# Patient Record
Sex: Female | Born: 1960 | Race: White | Hispanic: No | Marital: Married | Smoking: Never smoker
Health system: Southern US, Community
[De-identification: ages and names within clinical notes are randomized; demographics above are authoritative.]

## PROBLEM LIST (undated history)

## (undated) DIAGNOSIS — M810 Age-related osteoporosis without current pathological fracture: Secondary | ICD-10-CM

## (undated) DIAGNOSIS — E785 Hyperlipidemia, unspecified: Secondary | ICD-10-CM

## (undated) DIAGNOSIS — T7840XA Allergy, unspecified, initial encounter: Secondary | ICD-10-CM

## (undated) DIAGNOSIS — E079 Disorder of thyroid, unspecified: Secondary | ICD-10-CM

## (undated) HISTORY — DX: Disorder of thyroid, unspecified: E07.9

## (undated) HISTORY — DX: Age-related osteoporosis without current pathological fracture: M81.0

## (undated) HISTORY — DX: Allergy, unspecified, initial encounter: T78.40XA

## (undated) HISTORY — PX: TONSILLECTOMY: SHX5217

## (undated) HISTORY — DX: Hyperlipidemia, unspecified: E78.5

---

## 1997-09-12 ENCOUNTER — Other Ambulatory Visit: Admission: RE | Admit: 1997-09-12 | Discharge: 1997-09-12 | Payer: Self-pay | Admitting: *Deleted

## 1997-12-20 ENCOUNTER — Ambulatory Visit (HOSPITAL_COMMUNITY): Admission: RE | Admit: 1997-12-20 | Discharge: 1997-12-20 | Payer: Self-pay | Admitting: *Deleted

## 1998-11-04 ENCOUNTER — Other Ambulatory Visit: Admission: RE | Admit: 1998-11-04 | Discharge: 1998-11-04 | Payer: Self-pay | Admitting: *Deleted

## 1999-12-09 ENCOUNTER — Other Ambulatory Visit: Admission: RE | Admit: 1999-12-09 | Discharge: 1999-12-09 | Payer: Self-pay | Admitting: Obstetrics and Gynecology

## 2001-01-28 ENCOUNTER — Other Ambulatory Visit: Admission: RE | Admit: 2001-01-28 | Discharge: 2001-01-28 | Payer: Self-pay | Admitting: Obstetrics and Gynecology

## 2001-11-15 ENCOUNTER — Other Ambulatory Visit: Admission: RE | Admit: 2001-11-15 | Discharge: 2001-11-15 | Payer: Self-pay | Admitting: Obstetrics and Gynecology

## 2002-03-02 ENCOUNTER — Emergency Department (HOSPITAL_COMMUNITY): Admission: EM | Admit: 2002-03-02 | Discharge: 2002-03-02 | Payer: Self-pay | Admitting: Emergency Medicine

## 2002-12-05 ENCOUNTER — Other Ambulatory Visit: Admission: RE | Admit: 2002-12-05 | Discharge: 2002-12-05 | Payer: Self-pay | Admitting: Obstetrics and Gynecology

## 2004-01-10 ENCOUNTER — Other Ambulatory Visit: Admission: RE | Admit: 2004-01-10 | Discharge: 2004-01-10 | Payer: Self-pay | Admitting: Obstetrics and Gynecology

## 2005-03-03 ENCOUNTER — Other Ambulatory Visit: Admission: RE | Admit: 2005-03-03 | Discharge: 2005-03-03 | Payer: Self-pay | Admitting: Obstetrics and Gynecology

## 2010-06-17 ENCOUNTER — Encounter (INDEPENDENT_AMBULATORY_CARE_PROVIDER_SITE_OTHER): Payer: Self-pay | Admitting: *Deleted

## 2010-06-26 NOTE — Letter (Signed)
Summary: Pre Visit Letter Revised  [facility]  [address] [place], [place] [address]   Phone: [phone]  Fax: [phone]        06/17/2010 MRN: [id number] [facility] [address] [place], [place]  [address]             Procedure Date:  08/05/2010 @ 9:00   Direct colon-Dr. Christella Hartigan   Welcome to the Gastroenterology Division at [facility].    You are scheduled to see a nurse for your pre-procedure visit on 07/24/2010 at 9:00 on the 3rd floor at [facility], [address]  We ask that you try to arrive at our office 15 minutes prior to your appointment time to allow for check-in.  Please take a minute to review the attached form.  If you answer "Yes" to one or more of the questions on the first page, we ask that you call the person listed at your earliest opportunity.  If you answer "No" to all of the questions, please complete the rest of the form and bring it to your appointment.    Your nurse visit will consist of discussing your medical and surgical history, your immediate family medical history, and your medications.   If you are unable to list all of your medications on the form, please bring the medication bottles to your appointment and we will list them.  We will need to be aware of both prescribed and over the counter drugs.  We will need to know exact dosage information as well.    Please be prepared to read and sign documents such as consent forms, a financial agreement, and acknowledgement forms.  If necessary, and with your consent, a friend or relative is welcome to sit-in on the nurse visit with you.  Please bring your insurance card so that we may make a copy of it.  If your insurance requires a referral to see a specialist, please bring your referral form from your primary care physician.  No co-pay is required for this nurse visit.     If you cannot keep your appointment, please call [phone] to cancel or reschedule prior to your  appointment date.  This allows [place] the opportunity to schedule an appointment for another patient in need of care.    Thank you for choosing [facility] for your medical needs.  We appreciate the opportunity to care for you.  Please visit [place] at our website  to learn more about our practice.  Sincerely, The Gastroenterology Division

## 2010-07-24 ENCOUNTER — Ambulatory Visit (AMBULATORY_SURGERY_CENTER): Payer: Federal, State, Local not specified - PPO | Admitting: *Deleted

## 2010-07-24 VITALS — Ht 67.0 in | Wt 152.9 lb

## 2010-07-24 DIAGNOSIS — Z1211 Encounter for screening for malignant neoplasm of colon: Secondary | ICD-10-CM

## 2010-07-24 MED ORDER — PEG-KCL-NACL-NASULF-NA ASC-C 100 G PO SOLR: ORAL | Status: DC

## 2010-08-05 ENCOUNTER — Encounter: Payer: Self-pay | Admitting: Gastroenterology

## 2010-08-05 ENCOUNTER — Ambulatory Visit (AMBULATORY_SURGERY_CENTER): Payer: Federal, State, Local not specified - PPO | Admitting: Gastroenterology

## 2010-08-05 VITALS — BP 142/75 | HR 90 | Temp 97.4°F | Resp 20 | Ht 67.0 in | Wt 152.0 lb

## 2010-08-05 DIAGNOSIS — Z1211 Encounter for screening for malignant neoplasm of colon: Secondary | ICD-10-CM

## 2010-08-05 DIAGNOSIS — K5730 Diverticulosis of large intestine without perforation or abscess without bleeding: Secondary | ICD-10-CM

## 2010-08-05 MED ORDER — SODIUM CHLORIDE 0.9 % IV SOLN: 500.0000 mL | INTRAVENOUS | Status: AC

## 2010-08-05 NOTE — Patient Instructions (Signed)
Mild diverticulosis Repeat colonoscopy in 10 yrs. No need for stool testing for at least 5 years. See blue and green sheet for additional d/c instructions.

## 2010-08-06 ENCOUNTER — Telehealth: Payer: Self-pay | Admitting: *Deleted

## 2010-08-06 NOTE — Telephone Encounter (Signed)
No answer but left message to call us if she had any questions or if having any problems.  ID on answering machine

## 2015-05-02 MED FILL — SYNTHROID 137 MCG TABLET: 137 | 90 days supply | Qty: 90 | Fill #3

## 2015-08-05 MED FILL — SYNTHROID 137 MCG TABLET: 137 | 30 days supply | Qty: 30 | Fill #0

## 2015-08-26 DIAGNOSIS — J309 Allergic rhinitis, unspecified: Secondary | ICD-10-CM | POA: Diagnosis not present

## 2015-08-26 DIAGNOSIS — E039 Hypothyroidism, unspecified: Secondary | ICD-10-CM | POA: Diagnosis not present

## 2015-08-26 DIAGNOSIS — E7800 Pure hypercholesterolemia, unspecified: Secondary | ICD-10-CM | POA: Diagnosis not present

## 2015-08-30 DIAGNOSIS — E7800 Pure hypercholesterolemia, unspecified: Secondary | ICD-10-CM | POA: Diagnosis not present

## 2015-08-30 DIAGNOSIS — E039 Hypothyroidism, unspecified: Secondary | ICD-10-CM | POA: Diagnosis not present

## 2015-09-02 MED FILL — PRAVASTATIN NA 40 MG TAB: 40 | 30 days supply | Qty: 30 | Fill #0

## 2015-09-04 MED FILL — SYNTHROID 137 MCG TABLET: 137 | 30 days supply | Qty: 30 | Fill #0

## 2015-10-03 MED FILL — PRAVASTATIN NA 40 MG TAB: 40 | 30 days supply | Qty: 30 | Fill #1

## 2015-10-03 MED FILL — SYNTHROID 137 MCG TABLET: 137 | 30 days supply | Qty: 30 | Fill #1

## 2015-11-05 MED FILL — PRAVASTATIN NA 40 MG TAB: 40 | 30 days supply | Qty: 30 | Fill #2

## 2015-11-05 MED FILL — SYNTHROID 137 MCG TABLET: 137 | 30 days supply | Qty: 30 | Fill #2

## 2015-11-07 DIAGNOSIS — E7800 Pure hypercholesterolemia, unspecified: Secondary | ICD-10-CM | POA: Diagnosis not present

## 2015-12-05 MED FILL — PRAVASTATIN NA 40 MG TAB: 40 | 30 days supply | Qty: 30 | Fill #3

## 2015-12-05 MED FILL — SYNTHROID 137 MCG TABLET: 137 | 30 days supply | Qty: 30 | Fill #3

## 2015-12-30 DIAGNOSIS — Z1231 Encounter for screening mammogram for malignant neoplasm of breast: Secondary | ICD-10-CM | POA: Diagnosis not present

## 2015-12-30 DIAGNOSIS — Z6824 Body mass index (BMI) 24.0-24.9, adult: Secondary | ICD-10-CM | POA: Diagnosis not present

## 2015-12-30 DIAGNOSIS — Z01419 Encounter for gynecological examination (general) (routine) without abnormal findings: Secondary | ICD-10-CM | POA: Diagnosis not present

## 2015-12-30 DIAGNOSIS — Z1212 Encounter for screening for malignant neoplasm of rectum: Secondary | ICD-10-CM | POA: Diagnosis not present

## 2016-01-02 MED FILL — SYNTHROID 137 MCG TABLET: 137 | 30 days supply | Qty: 30 | Fill #4

## 2016-01-02 MED FILL — PRAVASTATIN NA 40 MG TAB: 40 | 30 days supply | Qty: 30 | Fill #4

## 2016-01-13 DIAGNOSIS — M25531 Pain in right wrist: Secondary | ICD-10-CM | POA: Diagnosis not present

## 2016-01-13 DIAGNOSIS — S52614A Nondisplaced fracture of right ulna styloid process, initial encounter for closed fracture: Secondary | ICD-10-CM | POA: Diagnosis not present

## 2016-02-03 DIAGNOSIS — S52614D Nondisplaced fracture of right ulna styloid process, subsequent encounter for closed fracture with routine healing: Secondary | ICD-10-CM | POA: Diagnosis not present

## 2016-02-03 DIAGNOSIS — S52551D Other extraarticular fracture of lower end of right radius, subsequent encounter for closed fracture with routine healing: Secondary | ICD-10-CM | POA: Diagnosis not present

## 2016-02-03 MED FILL — SYNTHROID 137 MCG TABLET: 137 | 30 days supply | Qty: 30 | Fill #5

## 2016-02-03 MED FILL — PRAVASTATIN NA 40 MG TAB: 40 | 30 days supply | Qty: 30 | Fill #5

## 2016-02-27 DIAGNOSIS — S52614D Nondisplaced fracture of right ulna styloid process, subsequent encounter for closed fracture with routine healing: Secondary | ICD-10-CM | POA: Diagnosis not present

## 2016-03-09 MED FILL — SYNTHROID 137 MCG TABLET: 137 | 30 days supply | Qty: 30 | Fill #6

## 2016-03-09 MED FILL — PRAVASTATIN NA 40 MG TAB: 40 | 30 days supply | Qty: 30 | Fill #6

## 2016-03-23 DIAGNOSIS — S52614D Nondisplaced fracture of right ulna styloid process, subsequent encounter for closed fracture with routine healing: Secondary | ICD-10-CM | POA: Diagnosis not present

## 2016-04-08 DIAGNOSIS — S52551D Other extraarticular fracture of lower end of right radius, subsequent encounter for closed fracture with routine healing: Secondary | ICD-10-CM | POA: Diagnosis not present

## 2016-04-08 DIAGNOSIS — M25531 Pain in right wrist: Secondary | ICD-10-CM | POA: Diagnosis not present

## 2016-04-08 MED FILL — PRAVASTATIN NA 40 MG TAB: 40 | 30 days supply | Qty: 30 | Fill #7

## 2016-04-08 MED FILL — SYNTHROID 137 MCG TABLET: 137 | 30 days supply | Qty: 30 | Fill #7

## 2016-04-15 DIAGNOSIS — S52551D Other extraarticular fracture of lower end of right radius, subsequent encounter for closed fracture with routine healing: Secondary | ICD-10-CM | POA: Diagnosis not present

## 2016-05-11 MED FILL — PRAVASTATIN NA 40 MG TAB: 40 | 30 days supply | Qty: 30 | Fill #8

## 2016-05-11 MED FILL — SYNTHROID 137 MCG TABLET: 137 | 30 days supply | Qty: 30 | Fill #8

## 2016-05-20 DIAGNOSIS — S52614D Nondisplaced fracture of right ulna styloid process, subsequent encounter for closed fracture with routine healing: Secondary | ICD-10-CM | POA: Diagnosis not present

## 2016-05-20 DIAGNOSIS — M25531 Pain in right wrist: Secondary | ICD-10-CM | POA: Diagnosis not present

## 2016-05-28 DIAGNOSIS — S52551D Other extraarticular fracture of lower end of right radius, subsequent encounter for closed fracture with routine healing: Secondary | ICD-10-CM | POA: Diagnosis not present

## 2016-05-28 DIAGNOSIS — S66911A Strain of unspecified muscle, fascia and tendon at wrist and hand level, right hand, initial encounter: Secondary | ICD-10-CM | POA: Diagnosis not present

## 2016-05-28 DIAGNOSIS — S52614D Nondisplaced fracture of right ulna styloid process, subsequent encounter for closed fracture with routine healing: Secondary | ICD-10-CM | POA: Diagnosis not present

## 2016-05-28 DIAGNOSIS — M25531 Pain in right wrist: Secondary | ICD-10-CM | POA: Diagnosis not present

## 2016-06-12 MED FILL — SYNTHROID 137 MCG TABLET: 137 | 30 days supply | Qty: 30 | Fill #9

## 2016-06-12 MED FILL — PRAVASTATIN NA 40 MG TAB: 40 | 30 days supply | Qty: 30 | Fill #9

## 2016-07-13 MED FILL — SYNTHROID 137 MCG TABLET: 137 | 30 days supply | Qty: 30 | Fill #10

## 2016-07-13 MED FILL — PRAVASTATIN NA 40 MG TAB: 40 | 30 days supply | Qty: 30 | Fill #10

## 2016-08-12 MED FILL — PRAVASTATIN NA 40 MG TAB: 40 | 30 days supply | Qty: 30 | Fill #11

## 2016-08-12 MED FILL — SYNTHROID 137 MCG TABLET: 137 | 30 days supply | Qty: 30 | Fill #11

## 2016-09-17 MED FILL — PRAVASTATIN NA 40 MG TAB: 40 | 30 days supply | Qty: 30 | Fill #0

## 2016-09-17 MED FILL — LEVOTHYROXINE 137 MCG TAB: 137 | 30 days supply | Qty: 30 | Fill #0

## 2016-10-02 DIAGNOSIS — Z23 Encounter for immunization: Secondary | ICD-10-CM | POA: Diagnosis not present

## 2016-10-02 DIAGNOSIS — J309 Allergic rhinitis, unspecified: Secondary | ICD-10-CM | POA: Diagnosis not present

## 2016-10-02 DIAGNOSIS — E7800 Pure hypercholesterolemia, unspecified: Secondary | ICD-10-CM | POA: Diagnosis not present

## 2016-10-02 DIAGNOSIS — E039 Hypothyroidism, unspecified: Secondary | ICD-10-CM | POA: Diagnosis not present

## 2016-10-02 DIAGNOSIS — Z1159 Encounter for screening for other viral diseases: Secondary | ICD-10-CM | POA: Diagnosis not present

## 2016-10-02 DIAGNOSIS — Z0000 Encounter for general adult medical examination without abnormal findings: Secondary | ICD-10-CM | POA: Diagnosis not present

## 2016-10-06 MED FILL — LEVOTHYROXINE 125 MCG TAB: 125 | 30 days supply | Qty: 30 | Fill #0

## 2016-11-04 MED FILL — LEVOTHYROXINE 125 MCG TAB: 125 | 30 days supply | Qty: 30 | Fill #1

## 2016-11-04 MED FILL — PRAVASTATIN NA 40 MG TAB: 40 | 30 days supply | Qty: 30 | Fill #1

## 2016-12-02 DIAGNOSIS — E039 Hypothyroidism, unspecified: Secondary | ICD-10-CM | POA: Diagnosis not present

## 2016-12-02 DIAGNOSIS — M8588 Other specified disorders of bone density and structure, other site: Secondary | ICD-10-CM | POA: Diagnosis not present

## 2016-12-14 MED FILL — PRAVASTATIN NA 40 MG TAB: 40 | 30 days supply | Qty: 30 | Fill #0

## 2016-12-14 MED FILL — LEVOTHYROXINE 125 MCG TAB: 125 | 30 days supply | Qty: 30 | Fill #2

## 2017-01-14 MED FILL — PRAVASTATIN NA 40 MG TAB: 40 | 30 days supply | Qty: 30 | Fill #1

## 2017-01-14 MED FILL — LEVOTHYROXINE 125 MCG TAB: 125 | 30 days supply | Qty: 30 | Fill #3

## 2017-02-02 DIAGNOSIS — Z01419 Encounter for gynecological examination (general) (routine) without abnormal findings: Secondary | ICD-10-CM | POA: Diagnosis not present

## 2017-02-02 DIAGNOSIS — R319 Hematuria, unspecified: Secondary | ICD-10-CM | POA: Diagnosis not present

## 2017-02-02 DIAGNOSIS — N390 Urinary tract infection, site not specified: Secondary | ICD-10-CM | POA: Diagnosis not present

## 2017-02-02 DIAGNOSIS — Z1231 Encounter for screening mammogram for malignant neoplasm of breast: Secondary | ICD-10-CM | POA: Diagnosis not present

## 2017-02-02 DIAGNOSIS — Z6827 Body mass index (BMI) 27.0-27.9, adult: Secondary | ICD-10-CM | POA: Diagnosis not present

## 2017-02-05 MED FILL — CIPROFLOXACIN HCL 500 MG TA: 500 | 7 days supply | Qty: 14 | Fill #0

## 2017-02-12 MED FILL — LEVOTHYROXINE 125 MCG TAB: 125 | 30 days supply | Qty: 30 | Fill #4

## 2017-02-12 MED FILL — PRAVASTATIN NA 40 MG TAB: 40 | 30 days supply | Qty: 30 | Fill #2

## 2017-03-08 DIAGNOSIS — E039 Hypothyroidism, unspecified: Secondary | ICD-10-CM | POA: Diagnosis not present

## 2017-03-15 MED FILL — LEVOTHYROXINE 125 MCG TAB: 125 | 30 days supply | Qty: 30 | Fill #5

## 2017-03-15 MED FILL — PRAVASTATIN NA 40 MG TAB: 40 | 30 days supply | Qty: 30 | Fill #3

## 2017-03-22 MED FILL — LEVOTHYROXINE 112 MCG TAB: 112 | 30 days supply | Qty: 30 | Fill #0

## 2017-04-15 MED FILL — PRAVASTATIN NA 40 MG TAB: 40 | 30 days supply | Qty: 30 | Fill #4

## 2017-04-29 DIAGNOSIS — H1851 Endothelial corneal dystrophy: Secondary | ICD-10-CM | POA: Diagnosis not present

## 2017-05-24 MED FILL — LEVOTHYROXINE 112 MCG TAB: 112 | 30 days supply | Qty: 30 | Fill #1

## 2017-05-24 MED FILL — PRAVASTATIN NA 40 MG TAB: 40 | 30 days supply | Qty: 30 | Fill #5

## 2017-06-18 DIAGNOSIS — E039 Hypothyroidism, unspecified: Secondary | ICD-10-CM | POA: Diagnosis not present

## 2017-06-18 MED FILL — PRAVASTATIN NA 40 MG TAB: 40 | 30 days supply | Qty: 30 | Fill #0

## 2017-06-24 MED FILL — LEVOTHYROXINE 112 MCG TAB: 112 | 30 days supply | Qty: 30 | Fill #0

## 2017-07-26 MED FILL — LEVOTHYROXINE 112 MCG TAB: 112 | 30 days supply | Qty: 30 | Fill #1

## 2017-07-26 MED FILL — PRAVASTATIN NA 40 MG TAB: 40 | 30 days supply | Qty: 30 | Fill #1

## 2017-08-24 MED FILL — LEVOTHYROXINE 112 MCG TAB: 112 | 30 days supply | Qty: 30 | Fill #2

## 2017-08-24 MED FILL — PRAVASTATIN NA 40 MG TAB: 40 | 30 days supply | Qty: 30 | Fill #2

## 2017-09-23 MED FILL — PRAVASTATIN NA 40 MG TAB: 40 | 30 days supply | Qty: 30 | Fill #3

## 2017-09-23 MED FILL — LEVOTHYROXINE 112 MCG TAB: 112 | 30 days supply | Qty: 30 | Fill #3

## 2017-10-19 DIAGNOSIS — E7800 Pure hypercholesterolemia, unspecified: Secondary | ICD-10-CM | POA: Diagnosis not present

## 2017-10-19 DIAGNOSIS — E039 Hypothyroidism, unspecified: Secondary | ICD-10-CM | POA: Diagnosis not present

## 2017-10-19 DIAGNOSIS — J302 Other seasonal allergic rhinitis: Secondary | ICD-10-CM | POA: Diagnosis not present

## 2017-10-19 MED FILL — LEVOTHYROXINE 112 MCG TAB: 112 | 90 days supply | Qty: 90 | Fill #0

## 2017-10-19 MED FILL — PRAVASTATIN NA 40 MG TAB: 40 | 90 days supply | Qty: 90 | Fill #0

## 2018-02-07 MED FILL — PRAVASTATIN NA 40 MG TAB: 40 | 90 days supply | Qty: 90 | Fill #1

## 2018-02-07 MED FILL — LEVOTHYROXINE 112 MCG TAB: 112 | 90 days supply | Qty: 90 | Fill #1

## 2018-02-22 DIAGNOSIS — Z1231 Encounter for screening mammogram for malignant neoplasm of breast: Secondary | ICD-10-CM | POA: Diagnosis not present

## 2018-02-22 DIAGNOSIS — Z6825 Body mass index (BMI) 25.0-25.9, adult: Secondary | ICD-10-CM | POA: Diagnosis not present

## 2018-02-22 DIAGNOSIS — Z01419 Encounter for gynecological examination (general) (routine) without abnormal findings: Secondary | ICD-10-CM | POA: Diagnosis not present

## 2018-05-17 MED FILL — LEVOTHYROXINE 112 MCG TAB: 112 | 90 days supply | Qty: 90 | Fill #0

## 2018-05-17 MED FILL — PRAVASTATIN NA 40 MG TAB: 40 | 90 days supply | Qty: 90 | Fill #0

## 2018-08-17 MED FILL — PRAVASTATIN NA 40 MG TAB: 40 | 90 days supply | Qty: 90 | Fill #1

## 2018-08-17 MED FILL — LEVOTHYROXINE 112 MCG TAB: 112 | 90 days supply | Qty: 90 | Fill #1

## 2018-10-05 DIAGNOSIS — E039 Hypothyroidism, unspecified: Secondary | ICD-10-CM | POA: Diagnosis not present

## 2018-10-05 DIAGNOSIS — J302 Other seasonal allergic rhinitis: Secondary | ICD-10-CM | POA: Diagnosis not present

## 2018-10-05 DIAGNOSIS — E7800 Pure hypercholesterolemia, unspecified: Secondary | ICD-10-CM | POA: Diagnosis not present

## 2018-10-05 DIAGNOSIS — M8580 Other specified disorders of bone density and structure, unspecified site: Secondary | ICD-10-CM | POA: Diagnosis not present

## 2018-11-16 MED FILL — PRAVASTATIN NA 40 MG TAB: 40 | 90 days supply | Qty: 90 | Fill #0

## 2018-11-16 MED FILL — LEVOTHYROXINE 112 MCG TAB: 112 | 90 days supply | Qty: 90 | Fill #0

## 2019-02-20 MED FILL — LEVOTHYROXINE 112 MCG TAB: 112 | 90 days supply | Qty: 90 | Fill #1

## 2019-02-20 MED FILL — PRAVASTATIN NA 40 MG TAB: 40 | 90 days supply | Qty: 90 | Fill #1

## 2019-04-04 DIAGNOSIS — Z1231 Encounter for screening mammogram for malignant neoplasm of breast: Secondary | ICD-10-CM | POA: Diagnosis not present

## 2019-04-04 DIAGNOSIS — E7800 Pure hypercholesterolemia, unspecified: Secondary | ICD-10-CM | POA: Diagnosis not present

## 2019-04-10 DIAGNOSIS — Z6828 Body mass index (BMI) 28.0-28.9, adult: Secondary | ICD-10-CM | POA: Diagnosis not present

## 2019-04-10 DIAGNOSIS — Z01419 Encounter for gynecological examination (general) (routine) without abnormal findings: Secondary | ICD-10-CM | POA: Diagnosis not present

## 2019-04-10 DIAGNOSIS — R6882 Decreased libido: Secondary | ICD-10-CM | POA: Diagnosis not present

## 2019-04-10 MED FILL — ATORVASTATIN 20 MG TABLET: 20 | 30 days supply | Qty: 30 | Fill #0

## 2019-04-29 MED FILL — LEVOTHYROXINE SODIUM 112 MC: 112 | 90 days supply | Qty: 90 | Fill #0

## 2019-05-15 MED FILL — ATORVASTATIN 20 MG TABLET: 20 | 30 days supply | Qty: 30 | Fill #1

## 2019-05-30 MED FILL — LEVOTHYROXINE SODIUM 112 MC: 112 | 90 days supply | Qty: 90 | Fill #0

## 2019-06-05 DIAGNOSIS — E785 Hyperlipidemia, unspecified: Secondary | ICD-10-CM | POA: Diagnosis not present

## 2019-06-05 DIAGNOSIS — Z79899 Other long term (current) drug therapy: Secondary | ICD-10-CM | POA: Diagnosis not present

## 2019-06-12 MED FILL — ATORVASTATIN 20 MG TABLET: 20 | 30 days supply | Qty: 30 | Fill #0

## 2019-07-11 MED FILL — ATORVASTATIN 20 MG TABLET: 20 | 30 days supply | Qty: 30 | Fill #1

## 2019-08-14 MED FILL — ATORVASTATIN 20 MG TABLET: 20 | 30 days supply | Qty: 30 | Fill #2

## 2019-08-17 DIAGNOSIS — E039 Hypothyroidism, unspecified: Secondary | ICD-10-CM | POA: Diagnosis not present

## 2019-08-18 MED FILL — LEVOTHYROXINE SODIUM 100 MC: 100 | 90 days supply | Qty: 90 | Fill #0

## 2019-09-11 MED FILL — ATORVASTATIN 20 MG TABLET: 20 | 30 days supply | Qty: 30 | Fill #3

## 2019-10-06 DIAGNOSIS — E7800 Pure hypercholesterolemia, unspecified: Secondary | ICD-10-CM | POA: Diagnosis not present

## 2019-10-06 DIAGNOSIS — E2839 Other primary ovarian failure: Secondary | ICD-10-CM | POA: Diagnosis not present

## 2019-10-06 DIAGNOSIS — Z0000 Encounter for general adult medical examination without abnormal findings: Secondary | ICD-10-CM | POA: Diagnosis not present

## 2019-10-06 DIAGNOSIS — J309 Allergic rhinitis, unspecified: Secondary | ICD-10-CM | POA: Diagnosis not present

## 2019-10-06 DIAGNOSIS — E039 Hypothyroidism, unspecified: Secondary | ICD-10-CM | POA: Diagnosis not present

## 2019-10-09 ENCOUNTER — Other Ambulatory Visit: Payer: Self-pay | Admitting: Family Medicine

## 2019-10-09 DIAGNOSIS — E2839 Other primary ovarian failure: Secondary | ICD-10-CM

## 2019-10-10 MED FILL — LEVOTHYROXINE SODIUM 88 MCG: 88 | 90 days supply | Qty: 90 | Fill #0

## 2019-10-11 ENCOUNTER — Other Ambulatory Visit: Payer: Self-pay | Admitting: Family Medicine

## 2019-10-11 DIAGNOSIS — E2839 Other primary ovarian failure: Secondary | ICD-10-CM

## 2019-10-16 MED FILL — ATORVASTATIN 20 MG TABLET: 20 | 30 days supply | Qty: 30 | Fill #4

## 2019-11-17 MED FILL — ATORVASTATIN 20 MG TABLET: 20 | 30 days supply | Qty: 30 | Fill #5

## 2019-12-01 ENCOUNTER — Other Ambulatory Visit: Payer: Self-pay

## 2019-12-01 ENCOUNTER — Ambulatory Visit
Admission: RE | Admit: 2019-12-01 | Discharge: 2019-12-01 | Disposition: A | Discharge status: Home / Self Care | Payer: Federal, State, Local not specified - PPO | Source: Ambulatory Visit | Attending: Family Medicine | Admitting: Family Medicine

## 2019-12-01 DIAGNOSIS — E2839 Other primary ovarian failure: Secondary | ICD-10-CM

## 2019-12-01 DIAGNOSIS — Z713 Dietary counseling and surveillance: Secondary | ICD-10-CM | POA: Diagnosis not present

## 2019-12-01 DIAGNOSIS — M8589 Other specified disorders of bone density and structure, multiple sites: Secondary | ICD-10-CM | POA: Diagnosis not present

## 2019-12-01 DIAGNOSIS — Z780 Asymptomatic menopausal state: Secondary | ICD-10-CM | POA: Diagnosis not present

## 2019-12-14 DIAGNOSIS — E039 Hypothyroidism, unspecified: Secondary | ICD-10-CM | POA: Diagnosis not present

## 2019-12-17 ENCOUNTER — Other Ambulatory Visit (HOSPITAL_BASED_OUTPATIENT_CLINIC_OR_DEPARTMENT_OTHER): Payer: Self-pay | Admitting: Family Medicine

## 2019-12-18 ENCOUNTER — Ambulatory Visit: Payer: Federal, State, Local not specified - PPO | Attending: Internal Medicine

## 2019-12-18 DIAGNOSIS — Z23 Encounter for immunization: Secondary | ICD-10-CM

## 2019-12-18 MED FILL — ATORVASTATIN CALCIUM 20 MG: 20 | 30 days supply | Qty: 30 | Fill #0

## 2019-12-20 DIAGNOSIS — Z713 Dietary counseling and surveillance: Secondary | ICD-10-CM | POA: Diagnosis not present

## 2019-12-23 DIAGNOSIS — Z1159 Encounter for screening for other viral diseases: Secondary | ICD-10-CM | POA: Diagnosis not present

## 2019-12-26 MED FILL — PFIZER-BIONTECH COVID-19 VA: 30 | 1 days supply | Qty: 0 | Fill #0

## 2020-01-10 MED FILL — LEVOTHYROXINE SODIUM 88 MCG: 88 | 90 days supply | Qty: 90 | Fill #1

## 2020-01-12 DIAGNOSIS — Z713 Dietary counseling and surveillance: Secondary | ICD-10-CM | POA: Diagnosis not present

## 2020-01-15 MED FILL — ATORVASTATIN CALCIUM 20 MG: 20 | 30 days supply | Qty: 30 | Fill #1

## 2020-02-08 DIAGNOSIS — Z713 Dietary counseling and surveillance: Secondary | ICD-10-CM | POA: Diagnosis not present

## 2020-02-12 MED FILL — ATORVASTATIN CALCIUM 20 MG: 20 | 30 days supply | Qty: 30 | Fill #2

## 2020-02-27 DIAGNOSIS — F4321 Adjustment disorder with depressed mood: Secondary | ICD-10-CM | POA: Diagnosis not present

## 2020-03-05 DIAGNOSIS — Z713 Dietary counseling and surveillance: Secondary | ICD-10-CM | POA: Diagnosis not present

## 2020-03-18 MED FILL — ATORVASTATIN CALCIUM 20 MG: 20 | 30 days supply | Qty: 30 | Fill #3

## 2020-03-29 ENCOUNTER — Other Ambulatory Visit (HOSPITAL_BASED_OUTPATIENT_CLINIC_OR_DEPARTMENT_OTHER): Payer: Self-pay | Admitting: Family Medicine

## 2020-03-29 DIAGNOSIS — E7800 Pure hypercholesterolemia, unspecified: Secondary | ICD-10-CM | POA: Diagnosis not present

## 2020-03-29 DIAGNOSIS — E039 Hypothyroidism, unspecified: Secondary | ICD-10-CM | POA: Diagnosis not present

## 2020-03-29 DIAGNOSIS — J309 Allergic rhinitis, unspecified: Secondary | ICD-10-CM | POA: Diagnosis not present

## 2020-03-29 DIAGNOSIS — M8580 Other specified disorders of bone density and structure, unspecified site: Secondary | ICD-10-CM | POA: Diagnosis not present

## 2020-03-29 MED FILL — LEVOTHYROXINE SODIUM 88 MCG: 88 | 90 days supply | Qty: 90 | Fill #0

## 2020-04-03 DIAGNOSIS — Z713 Dietary counseling and surveillance: Secondary | ICD-10-CM | POA: Diagnosis not present

## 2020-04-15 MED FILL — ATORVASTATIN CALCIUM 20 MG: 20 | 30 days supply | Qty: 30 | Fill #4

## 2020-04-25 DIAGNOSIS — Z713 Dietary counseling and surveillance: Secondary | ICD-10-CM | POA: Diagnosis not present

## 2020-04-29 ENCOUNTER — Other Ambulatory Visit (HOSPITAL_BASED_OUTPATIENT_CLINIC_OR_DEPARTMENT_OTHER): Payer: Self-pay | Admitting: Family Medicine

## 2020-04-29 MED FILL — MECLIZINE 25 MG TABLET: 25 | 7 days supply | Qty: 30 | Fill #0

## 2020-05-13 MED FILL — ATORVASTATIN CALCIUM 20 MG: 20 | 30 days supply | Qty: 30 | Fill #5

## 2020-05-22 DIAGNOSIS — Z713 Dietary counseling and surveillance: Secondary | ICD-10-CM | POA: Diagnosis not present

## 2020-05-27 ENCOUNTER — Other Ambulatory Visit (HOSPITAL_BASED_OUTPATIENT_CLINIC_OR_DEPARTMENT_OTHER): Payer: Self-pay | Admitting: Obstetrics and Gynecology

## 2020-05-27 DIAGNOSIS — Z1231 Encounter for screening mammogram for malignant neoplasm of breast: Secondary | ICD-10-CM | POA: Diagnosis not present

## 2020-05-27 DIAGNOSIS — Z01419 Encounter for gynecological examination (general) (routine) without abnormal findings: Secondary | ICD-10-CM | POA: Diagnosis not present

## 2020-05-27 MED FILL — ZOLPIDEM TARTRATE 10 MG TAB: 10 | 30 days supply | Qty: 30 | Fill #0

## 2020-06-17 MED FILL — ATORVASTATIN CALCIUM 20 MG: 20 | 90 days supply | Qty: 90 | Fill #0

## 2020-06-20 DIAGNOSIS — Z713 Dietary counseling and surveillance: Secondary | ICD-10-CM | POA: Diagnosis not present

## 2020-07-08 MED FILL — LEVOTHYROXINE SODIUM 88 MCG: 88 | 90 days supply | Qty: 90 | Fill #1

## 2020-07-24 DIAGNOSIS — Z713 Dietary counseling and surveillance: Secondary | ICD-10-CM | POA: Diagnosis not present

## 2020-08-13 DIAGNOSIS — H18513 Endothelial corneal dystrophy, bilateral: Secondary | ICD-10-CM | POA: Diagnosis not present

## 2020-08-15 ENCOUNTER — Encounter: Payer: Self-pay | Admitting: Gastroenterology

## 2020-08-27 DIAGNOSIS — H18513 Endothelial corneal dystrophy, bilateral: Secondary | ICD-10-CM | POA: Diagnosis not present

## 2020-08-27 DIAGNOSIS — Z713 Dietary counseling and surveillance: Secondary | ICD-10-CM | POA: Diagnosis not present

## 2020-09-08 MED FILL — Atorvastatin Calcium Tab 20 MG (Base Equivalent): ORAL | 90 days supply | Qty: 90 | Fill #0 | Status: AC

## 2020-09-09 ENCOUNTER — Other Ambulatory Visit (HOSPITAL_BASED_OUTPATIENT_CLINIC_OR_DEPARTMENT_OTHER): Payer: Self-pay

## 2020-09-09 DIAGNOSIS — H18513 Endothelial corneal dystrophy, bilateral: Secondary | ICD-10-CM | POA: Diagnosis not present

## 2020-09-09 MED ORDER — RHOPRESSA 0.02 % OP SOLN
OPHTHALMIC | 3 refills | Status: DC
  Filled 2020-09-09: qty 2.5, 25d supply, fill #0

## 2020-09-10 ENCOUNTER — Other Ambulatory Visit (HOSPITAL_BASED_OUTPATIENT_CLINIC_OR_DEPARTMENT_OTHER): Payer: Self-pay

## 2020-09-26 DIAGNOSIS — Z713 Dietary counseling and surveillance: Secondary | ICD-10-CM | POA: Diagnosis not present

## 2020-09-30 ENCOUNTER — Other Ambulatory Visit (HOSPITAL_BASED_OUTPATIENT_CLINIC_OR_DEPARTMENT_OTHER): Payer: Self-pay

## 2020-09-30 MED ORDER — LEVOTHYROXINE SODIUM 88 MCG PO TABS
ORAL_TABLET | ORAL | 0 refills | Status: DC
  Filled 2020-09-30: qty 30, 30d supply, fill #0

## 2020-10-06 MED FILL — Zolpidem Tartrate Tab 10 MG: ORAL | 30 days supply | Qty: 30 | Fill #0 | Status: AC

## 2020-10-07 ENCOUNTER — Other Ambulatory Visit (HOSPITAL_BASED_OUTPATIENT_CLINIC_OR_DEPARTMENT_OTHER): Payer: Self-pay

## 2020-10-22 DIAGNOSIS — Z713 Dietary counseling and surveillance: Secondary | ICD-10-CM | POA: Diagnosis not present

## 2020-10-28 DIAGNOSIS — F4321 Adjustment disorder with depressed mood: Secondary | ICD-10-CM | POA: Diagnosis not present

## 2020-10-31 ENCOUNTER — Other Ambulatory Visit (HOSPITAL_BASED_OUTPATIENT_CLINIC_OR_DEPARTMENT_OTHER): Payer: Self-pay

## 2020-10-31 DIAGNOSIS — E7800 Pure hypercholesterolemia, unspecified: Secondary | ICD-10-CM | POA: Diagnosis not present

## 2020-10-31 DIAGNOSIS — J309 Allergic rhinitis, unspecified: Secondary | ICD-10-CM | POA: Diagnosis not present

## 2020-10-31 DIAGNOSIS — Z0000 Encounter for general adult medical examination without abnormal findings: Secondary | ICD-10-CM | POA: Diagnosis not present

## 2020-10-31 DIAGNOSIS — E039 Hypothyroidism, unspecified: Secondary | ICD-10-CM | POA: Diagnosis not present

## 2020-10-31 DIAGNOSIS — M8580 Other specified disorders of bone density and structure, unspecified site: Secondary | ICD-10-CM | POA: Diagnosis not present

## 2020-10-31 MED ORDER — LEVOTHYROXINE SODIUM 88 MCG PO TABS
ORAL_TABLET | ORAL | 3 refills | Status: DC
  Filled 2020-10-31: qty 90, 90d supply, fill #0
  Filled 2021-02-02: qty 90, 90d supply, fill #1
  Filled 2021-05-04 – 2021-05-05 (×2): qty 90, 90d supply, fill #2
  Filled 2021-08-10: qty 90, 90d supply, fill #3

## 2020-10-31 MED ORDER — ATORVASTATIN CALCIUM 20 MG PO TABS
ORAL_TABLET | ORAL | 3 refills | Status: DC
  Filled 2020-10-31: qty 90, 90d supply, fill #0

## 2020-11-04 ENCOUNTER — Other Ambulatory Visit (HOSPITAL_BASED_OUTPATIENT_CLINIC_OR_DEPARTMENT_OTHER): Payer: Self-pay

## 2020-11-07 ENCOUNTER — Encounter: Payer: Self-pay | Admitting: Gastroenterology

## 2020-11-18 DIAGNOSIS — Z713 Dietary counseling and surveillance: Secondary | ICD-10-CM | POA: Diagnosis not present

## 2020-11-26 ENCOUNTER — Other Ambulatory Visit: Payer: Self-pay

## 2020-11-26 ENCOUNTER — Other Ambulatory Visit (HOSPITAL_BASED_OUTPATIENT_CLINIC_OR_DEPARTMENT_OTHER): Payer: Self-pay

## 2020-11-26 ENCOUNTER — Ambulatory Visit (AMBULATORY_SURGERY_CENTER): Payer: Federal, State, Local not specified - PPO

## 2020-11-26 VITALS — Ht 67.0 in | Wt 170.0 lb

## 2020-11-26 DIAGNOSIS — Z1211 Encounter for screening for malignant neoplasm of colon: Secondary | ICD-10-CM

## 2020-11-26 MED ORDER — NA SULFATE-K SULFATE-MG SULF 17.5-3.13-1.6 GM/177ML PO SOLN
1.0000 | Freq: Once | ORAL | 0 refills | Status: AC
  Filled 2020-11-26: qty 354, 2d supply, fill #0

## 2020-11-26 NOTE — Progress Notes (Signed)
Patient's pre-visit was done today over the phone with the patient Name,DOB and address verified. Patient denies any allergies to Eggs and Soy. Patient denies any problems with anesthesia/sedation. Patient denies taking diet pills or blood thinners. No home Oxygen. Packet of Prep instructions mailed to patient including a copy of a consent form-pt is aware. Patient understands to call us back with any questions or concerns. Patient is aware of our care-partner policy and 0000000 safety protocol.   EMMI education assigned to the patient for the procedure, sent to [place].   The patient is COVID-19 vaccinated.

## 2020-12-02 ENCOUNTER — Other Ambulatory Visit (HOSPITAL_BASED_OUTPATIENT_CLINIC_OR_DEPARTMENT_OTHER): Payer: Self-pay

## 2020-12-11 ENCOUNTER — Other Ambulatory Visit: Payer: Self-pay

## 2020-12-11 ENCOUNTER — Encounter: Payer: Self-pay | Admitting: Gastroenterology

## 2020-12-11 ENCOUNTER — Ambulatory Visit (AMBULATORY_SURGERY_CENTER): Payer: Federal, State, Local not specified - PPO | Admitting: Gastroenterology

## 2020-12-11 VITALS — BP 128/79 | HR 63 | Temp 98.4°F | Resp 10 | Ht 67.0 in | Wt 170.0 lb

## 2020-12-11 DIAGNOSIS — K635 Polyp of colon: Secondary | ICD-10-CM | POA: Diagnosis not present

## 2020-12-11 DIAGNOSIS — D124 Benign neoplasm of descending colon: Secondary | ICD-10-CM

## 2020-12-11 DIAGNOSIS — Z1211 Encounter for screening for malignant neoplasm of colon: Secondary | ICD-10-CM | POA: Diagnosis not present

## 2020-12-11 MED ORDER — SODIUM CHLORIDE 0.9 % IV SOLN: 500.0000 mL | Freq: Once | INTRAVENOUS | Status: DC

## 2020-12-11 NOTE — Progress Notes (Signed)
Called to room to assist during endoscopic procedure.  Patient ID and intended procedure confirmed with present staff. Received instructions for my participation in the procedure from the performing physician.  

## 2020-12-11 NOTE — Progress Notes (Signed)
VS taken by C.W. 

## 2020-12-11 NOTE — Progress Notes (Signed)
Pt's states no medical or surgical changes since previsit or office visit. 

## 2020-12-11 NOTE — Patient Instructions (Signed)
Handout on polyps and diverticulosis given    YOU HAD AN ENDOSCOPIC PROCEDURE TODAY AT THE [facility]:   Refer to the procedure report that was given to you for any specific questions about what was found during the examination.  If the procedure report does not answer your questions, please call your gastroenterologist to clarify.  If you requested that your care partner not be given the details of your procedure findings, then the procedure report has been included in a sealed envelope for you to review at your convenience later.  YOU SHOULD EXPECT: Some feelings of bloating in the abdomen. Passage of more gas than usual.  Walking can help get rid of the air that was put into your GI tract during the procedure and reduce the bloating. If you had a lower endoscopy (such as a colonoscopy or flexible sigmoidoscopy) you may notice spotting of blood in your stool or on the toilet paper. If you underwent a bowel prep for your procedure, you may not have a normal bowel movement for a few days.  Please Note:  You might notice some irritation and congestion in your nose or some drainage.  This is from the oxygen used during your procedure.  There is no need for concern and it should clear up in a day or so.  SYMPTOMS TO REPORT IMMEDIATELY:   Following lower endoscopy (colonoscopy or flexible sigmoidoscopy):  Excessive amounts of blood in the stool  Significant tenderness or worsening of abdominal pains  Swelling of the abdomen that is new, acute  Fever of 100F or higher   For urgent or emergent issues, a gastroenterologist can be reached at any hour by calling [phone]. Do not use MyChart messaging for urgent concerns.    DIET:  We do recommend a small meal at first, but then you may proceed to your regular diet.  Drink plenty of fluids but you should avoid alcoholic beverages for 24 hours.  ACTIVITY:  You should plan to take it easy for the rest of today and you should NOT  DRIVE or use heavy machinery until tomorrow (because of the sedation medicines used during the test).    FOLLOW UP: Our staff will call the number listed on your records 48-72 hours following your procedure to check on you and address any questions or concerns that you may have regarding the information given to you following your procedure. If we do not reach you, we will leave a message.  We will attempt to reach you two times.  During this call, we will ask if you have developed any symptoms of COVID 19. If you develop any symptoms (ie: fever, flu-like symptoms, shortness of breath, cough etc.) before then, please call [phone].  If you test positive for Covid 19 in the 2 weeks post procedure, please call and report this information to us.    If any biopsies were taken you will be contacted by phone or by letter within the next 1-3 weeks.  Please call us at [phone] if you have not heard about the biopsies in 3 weeks.    SIGNATURES/CONFIDENTIALITY: You and/or your care partner have signed paperwork which will be entered into your electronic medical record.  These signatures attest to the fact that that the information above on your After Visit Summary has been reviewed and is understood.  Full responsibility of the confidentiality of this discharge information lies with you and/or your care-partner. 

## 2020-12-11 NOTE — Progress Notes (Signed)
PT taken to PACU. Monitors in place. VSS. Report given to RN. 

## 2020-12-11 NOTE — Op Note (Signed)
[facility] Patient Name: Alyssa Estrada Procedure Date: 12/11/2020 8:58 AM MRN: [id number] Endoscopist: Milus Banister , MD Age: 60 Referring MD:  Date of Birth: Sep 19, 1960 Gender: Female Account #: [id number] Procedure:                Colonoscopy Indications:              Screening for colorectal malignant neoplasm Medicines:                Monitored Anesthesia Care Procedure:                Pre-Anesthesia Assessment:                           - Prior to the procedure, a History and Physical                            was performed, and patient medications and                            allergies were reviewed. The patient's tolerance of                            previous anesthesia was also reviewed. The risks                            and benefits of the procedure and the sedation                            options and risks were discussed with the patient.                            All questions were answered, and informed consent                            was obtained. Prior Anticoagulants: The patient has                            taken no previous anticoagulant or antiplatelet                            agents. ASA Grade Assessment: II - A patient with                            mild systemic disease. After reviewing the risks                            and benefits, the patient was deemed in                            satisfactory condition to undergo the procedure.                           After obtaining informed consent, the colonoscope  was passed under direct vision. Throughout the                            procedure, the patient's blood pressure, pulse, and                            oxygen saturations were monitored continuously. The                            Colonoscope was introduced through the anus and                            advanced to the the cecum, identified by                            appendiceal orifice and  ileocecal valve. The                            colonoscopy was performed without difficulty. The                            patient tolerated the procedure well. The quality                            of the bowel preparation was good. The ileocecal                            valve, appendiceal orifice, and rectum were                            photographed. Scope In: 9:07:26 AM Scope Out: 9:21:52 AM Scope Withdrawal Time: 0 hours 10 minutes 30 seconds  Total Procedure Duration: 0 hours 14 minutes 26 seconds  Findings:                 A 3 mm polyp was found in the descending colon. The                            polyp was sessile. The polyp was removed with a                            cold snare. Resection and retrieval were complete.                           Multiple small and large-mouthed diverticula were                            found in the left colon.                           The exam was otherwise without abnormality on                            direct and retroflexion views. Complications:  No immediate complications. Estimated blood loss:                            None. Estimated Blood Loss:     Estimated blood loss: none. Impression:               - One 3 mm polyp in the descending colon, removed                            with a cold snare. Resected and retrieved.                           - Diverticulosis in the left colon.                           - The examination was otherwise normal on direct                            and retroflexion views. Recommendation:           - Patient has a contact number available for                            emergencies. The signs and symptoms of potential                            delayed complications were discussed with the                            patient. Return to normal activities tomorrow.                            Written discharge instructions were provided to the                            patient.                            - Resume previous diet.                           - Continue present medications.                           - Await pathology results. Milus Banister, MD 12/11/2020 9:24:10 AM This report has been signed electronically.

## 2020-12-11 NOTE — Progress Notes (Signed)
HPI: This is a woman at routine risk for CRC   ROS: complete GI ROS as described in HPI, all other review negative.  Constitutional:  No unintentional weight loss   Past Medical History:  Diagnosis Date   Allergy    Hyperlipidemia    Osteoporosis    osteopenia   Thyroid disease     Past Surgical History:  Procedure Laterality Date   TONSILLECTOMY      Current Outpatient Medications  Medication Sig Dispense Refill   atorvastatin (LIPITOR) 20 MG tablet TAKE 1 TABLET BY MOUTH ONCE DAILY 30 tablet 5   Calcium 500-100 MG-UNIT CHEW Calcium 500     fish oil-omega-3 fatty acids 1000 MG capsule Take 1 g by mouth 2 (two) times daily.       levothyroxine (SYNTHROID) 88 MCG tablet Take 1 tablet in the morning on an empty stomach Orally once a day 90 days 90 tablet 3   ibuprofen (ADVIL,MOTRIN) 100 MG tablet Take 100 mg by mouth every 6 (six) hours as needed.       zolpidem (AMBIEN) 10 MG tablet TAKE 1 TABLET BY MOUTH ONCE DAILY 30 tablet 1   Current Facility-Administered Medications  Medication Dose Route Frequency Provider Last Rate Last Admin   0.9 %  sodium chloride infusion  500 mL Intravenous Continuous Milus Banister, MD       0.9 %  sodium chloride infusion  500 mL Intravenous Once Milus Banister, MD        Allergies as of 12/11/2020 - Review Complete 12/11/2020  Allergen Reaction Noted   Sulfa antibiotics Rash 07/24/2010    Family History  Problem Relation Age of Onset   Pancreatic cancer Father    Pancreatic cancer Paternal Grandmother    Colon cancer Neg Hx    Colon polyps Neg Hx    Esophageal cancer Neg Hx    Stomach cancer Neg Hx    Rectal cancer Neg Hx     Social History   Socioeconomic History   Marital status: Married    Spouse name: Not on file   Number of children: Not on file   Years of education: Not on file   Highest education level: Not on file  Occupational History   Not on file  Tobacco Use   Smoking status: Never   Smokeless tobacco:  Never  Vaping Use   Vaping Use: Never used  Substance and Sexual Activity   Alcohol use: Yes    Comment: once every couple of months/social   Drug use: No   Sexual activity: Not on file  Other Topics Concern   Not on file  Social History Narrative   Not on file   Social Determinants of Health   Financial Resource Strain: Not on file  Food Insecurity: Not on file  Transportation Needs: Not on file  Physical Activity: Not on file  Stress: Not on file  Social Connections: Not on file  Intimate Partner Violence: Not on file     Physical Exam: BP 133/84   Pulse 76   Temp 98.4 F (36.9 C)   Ht '5\' 7"'$  (1.702 m)   Wt 170 lb (77.1 kg)   SpO2 98%   BMI 26.63 kg/m  Constitutional: generally well-appearing Psychiatric: alert and oriented x3 Lungs: CTA bilaterally Heart: no MCR  Assessment and plan: 60 y.o. female with routine risk for CRC  Screening colonoscopy today  Care is appropriate for the ambulatory setting.  Owens Loffler, MD [facility] 12/11/2020,  8:56 AM

## 2020-12-12 DIAGNOSIS — Z713 Dietary counseling and surveillance: Secondary | ICD-10-CM | POA: Diagnosis not present

## 2020-12-13 ENCOUNTER — Encounter: Payer: Self-pay | Admitting: Gastroenterology

## 2020-12-13 ENCOUNTER — Telehealth: Payer: Self-pay

## 2020-12-13 ENCOUNTER — Telehealth: Payer: Self-pay | Admitting: *Deleted

## 2020-12-13 NOTE — Telephone Encounter (Signed)
  Follow up Call-  Call back number 12/11/2020  Post procedure Call Back phone  # [phone]  Permission to leave phone message Yes  Some recent data might be hidden    Left message

## 2020-12-13 NOTE — Telephone Encounter (Signed)
  Follow up Call-  Call back number 12/11/2020  Post procedure Call Back phone  # [phone]  Permission to leave phone message Yes  Some recent data might be hidden     Patient questions:  Do you have a fever, pain , or abdominal swelling? No. Pain Score  0 *  Have you tolerated food without any problems? Yes.    Have you been able to return to your normal activities? Yes.    Do you have any questions about your discharge instructions: Diet   No. Medications  No. Follow up visit  No.  Do you have questions or concerns about your Care? No.  Actions: * If pain score is 4 or above: No action needed, pain <4.  Have you developed a fever since your procedure? no  2.   Have you had an respiratory symptoms (SOB or cough) since your procedure? no  3.   Have you tested positive for COVID 19 since your procedure no  4.   Have you had any family members/close contacts diagnosed with the COVID 19 since your procedure?  no   If yes to any of these questions please route to Joylene John, RN and Joella Prince, RN

## 2020-12-17 ENCOUNTER — Other Ambulatory Visit (HOSPITAL_BASED_OUTPATIENT_CLINIC_OR_DEPARTMENT_OTHER): Payer: Self-pay

## 2020-12-17 MED ORDER — ATORVASTATIN CALCIUM 20 MG PO TABS
ORAL_TABLET | ORAL | 3 refills | Status: DC
  Filled 2020-12-17: qty 90, 90d supply, fill #0
  Filled 2021-03-20: qty 90, 90d supply, fill #1
  Filled 2021-06-19: qty 90, 90d supply, fill #2
  Filled 2021-09-21: qty 90, 90d supply, fill #3

## 2021-01-21 DIAGNOSIS — Z713 Dietary counseling and surveillance: Secondary | ICD-10-CM | POA: Diagnosis not present

## 2021-01-31 ENCOUNTER — Other Ambulatory Visit (HOSPITAL_COMMUNITY): Payer: Self-pay

## 2021-02-03 ENCOUNTER — Other Ambulatory Visit (HOSPITAL_BASED_OUTPATIENT_CLINIC_OR_DEPARTMENT_OTHER): Payer: Self-pay

## 2021-02-19 DIAGNOSIS — Z713 Dietary counseling and surveillance: Secondary | ICD-10-CM | POA: Diagnosis not present

## 2021-03-20 ENCOUNTER — Other Ambulatory Visit (HOSPITAL_BASED_OUTPATIENT_CLINIC_OR_DEPARTMENT_OTHER): Payer: Self-pay

## 2021-03-21 DIAGNOSIS — Z713 Dietary counseling and surveillance: Secondary | ICD-10-CM | POA: Diagnosis not present

## 2021-04-24 DIAGNOSIS — Z713 Dietary counseling and surveillance: Secondary | ICD-10-CM | POA: Diagnosis not present

## 2021-05-05 ENCOUNTER — Other Ambulatory Visit (HOSPITAL_BASED_OUTPATIENT_CLINIC_OR_DEPARTMENT_OTHER): Payer: Self-pay

## 2021-05-06 ENCOUNTER — Other Ambulatory Visit (HOSPITAL_BASED_OUTPATIENT_CLINIC_OR_DEPARTMENT_OTHER): Payer: Self-pay

## 2021-05-29 DIAGNOSIS — Z713 Dietary counseling and surveillance: Secondary | ICD-10-CM | POA: Diagnosis not present

## 2021-06-20 ENCOUNTER — Other Ambulatory Visit (HOSPITAL_BASED_OUTPATIENT_CLINIC_OR_DEPARTMENT_OTHER): Payer: Self-pay

## 2021-06-25 DIAGNOSIS — Z713 Dietary counseling and surveillance: Secondary | ICD-10-CM | POA: Diagnosis not present

## 2021-06-26 DIAGNOSIS — Z01419 Encounter for gynecological examination (general) (routine) without abnormal findings: Secondary | ICD-10-CM | POA: Diagnosis not present

## 2021-06-26 DIAGNOSIS — Z1231 Encounter for screening mammogram for malignant neoplasm of breast: Secondary | ICD-10-CM | POA: Diagnosis not present

## 2021-07-23 DIAGNOSIS — Z713 Dietary counseling and surveillance: Secondary | ICD-10-CM | POA: Diagnosis not present

## 2021-08-11 ENCOUNTER — Other Ambulatory Visit (HOSPITAL_BASED_OUTPATIENT_CLINIC_OR_DEPARTMENT_OTHER): Payer: Self-pay

## 2021-08-25 DIAGNOSIS — Z713 Dietary counseling and surveillance: Secondary | ICD-10-CM | POA: Diagnosis not present

## 2021-09-22 ENCOUNTER — Other Ambulatory Visit (HOSPITAL_BASED_OUTPATIENT_CLINIC_OR_DEPARTMENT_OTHER): Payer: Self-pay

## 2021-09-24 DIAGNOSIS — Z713 Dietary counseling and surveillance: Secondary | ICD-10-CM | POA: Diagnosis not present

## 2021-10-07 ENCOUNTER — Encounter (HOSPITAL_COMMUNITY): Payer: Self-pay

## 2021-10-07 ENCOUNTER — Emergency Department (HOSPITAL_COMMUNITY)
Admission: EM | Admit: 2021-10-07 | Discharge: 2021-10-07 | Disposition: A | Discharge status: Home / Self Care | Payer: Federal, State, Local not specified - PPO | Attending: Emergency Medicine | Admitting: Emergency Medicine

## 2021-10-07 ENCOUNTER — Other Ambulatory Visit: Payer: Self-pay

## 2021-10-07 ENCOUNTER — Emergency Department (HOSPITAL_COMMUNITY): Payer: Federal, State, Local not specified - PPO

## 2021-10-07 DIAGNOSIS — R109 Unspecified abdominal pain: Secondary | ICD-10-CM | POA: Diagnosis not present

## 2021-10-07 DIAGNOSIS — M25551 Pain in right hip: Secondary | ICD-10-CM | POA: Diagnosis not present

## 2021-10-07 DIAGNOSIS — Z043 Encounter for examination and observation following other accident: Secondary | ICD-10-CM | POA: Diagnosis not present

## 2021-10-07 DIAGNOSIS — S4991XA Unspecified injury of right shoulder and upper arm, initial encounter: Secondary | ICD-10-CM | POA: Diagnosis not present

## 2021-10-07 DIAGNOSIS — W19XXXA Unspecified fall, initial encounter: Secondary | ICD-10-CM

## 2021-10-07 DIAGNOSIS — S42201A Unspecified fracture of upper end of right humerus, initial encounter for closed fracture: Secondary | ICD-10-CM | POA: Diagnosis not present

## 2021-10-07 DIAGNOSIS — S2241XA Multiple fractures of ribs, right side, initial encounter for closed fracture: Secondary | ICD-10-CM | POA: Diagnosis not present

## 2021-10-07 DIAGNOSIS — W11XXXA Fall on and from ladder, initial encounter: Secondary | ICD-10-CM | POA: Diagnosis not present

## 2021-10-07 DIAGNOSIS — S42211A Unspecified displaced fracture of surgical neck of right humerus, initial encounter for closed fracture: Secondary | ICD-10-CM | POA: Diagnosis not present

## 2021-10-07 MED ORDER — HYDROCODONE-ACETAMINOPHEN 5-325 MG PO TABS
1.0000 | ORAL_TABLET | Freq: Once | ORAL | Status: AC
  Administered 2021-10-07: 1 via ORAL
  Filled 2021-10-07: qty 1

## 2021-10-07 MED ORDER — HYDROCODONE-ACETAMINOPHEN 5-325 MG PO TABS
2.0000 | ORAL_TABLET | ORAL | 0 refills | Status: DC | PRN
  Filled 2021-10-07: qty 10, 1d supply, fill #0

## 2021-10-07 MED ORDER — HYDROCODONE-ACETAMINOPHEN 5-325 MG PO TABS: 2.0000 | ORAL_TABLET | ORAL | 0 refills | Status: DC | PRN

## 2021-10-07 NOTE — Discharge Instructions (Signed)
Wear the sling and follow-up with the orthopedic doctor in the office this week.  Take the pain medication as prescribed.  Return to the ED with increasing pain, weakness, numbness, tingling, confusion, behavior change, any other concerns.

## 2021-10-07 NOTE — ED Provider Notes (Signed)
[facility] DEPT Provider Note   CSN: [id number] Arrival date & time: 10/07/21  2005     History {Add pertinent medical, surgical, social history, OB history to HPI:1} Chief Complaint  Patient presents with   Arm Pain   Shoulder Pain   Flank Pain    Alyssa Estrada is a 61 y.o. female.  Patient presents with right arm pain and hip pain after falling off of a stepladder about 2 hours ago.  She estimates she was about 3 to 4 feet off the ground.  She fell onto her right side but denies hitting her head or losing consciousness.  She remembers the whole fall.  Denies any head, neck, back, chest or abdominal pain.  Complaining of pain to her right upper arm and shoulder.  Also having some right hip pain but is able to ambulate.  Denies any midline neck or back pain.  Recalls the whole fall.  No preceding dizziness or lightheadedness.  No syncope.  No blood thinner use. Pain with range of motion of her fingers.  No numbness or tingling.  The history is provided by the patient and a relative.  Arm Pain Pertinent negatives include no chest pain, no abdominal pain, no headaches and no shortness of breath.  Shoulder Pain Associated symptoms: no back pain, no fever and no neck pain   Flank Pain Pertinent negatives include no chest pain, no abdominal pain, no headaches and no shortness of breath.       Home Medications Prior to Admission medications   Medication Sig Start Date End Date Taking? Authorizing Provider  atorvastatin (LIPITOR) 20 MG tablet TAKE 1 TABLET BY MOUTH ONCE DAILY 12/17/19 12/16/20  Carol Ada, MD  atorvastatin (LIPITOR) 20 MG tablet Take 1 tablet by mouth once daily 12/16/20     Calcium 500-100 MG-UNIT CHEW Calcium 500    [provider]  fish oil-omega-3 fatty acids 1000 MG capsule Take 1 g by mouth 2 (two) times daily.      [provider]  ibuprofen (ADVIL,MOTRIN) 100 MG tablet Take 100 mg by mouth every 6 (six) hours  as needed.      [provider]  levothyroxine (SYNTHROID) 88 MCG tablet Take 1 tablet in the morning on an empty stomach Orally once a day 90 days 10/31/20     zolpidem (AMBIEN) 10 MG tablet TAKE 1 TABLET BY MOUTH ONCE DAILY 05/27/20 11/23/20  Dian Queen, MD      Allergies    Sulfa antibiotics    Review of Systems   Review of Systems  Constitutional:  Negative for activity change, appetite change and fever.  HENT:  Negative for congestion and rhinorrhea.   Respiratory:  Negative for cough, chest tightness and shortness of breath.   Cardiovascular:  Negative for chest pain.  Gastrointestinal:  Negative for abdominal pain, nausea and vomiting.  Genitourinary:  Positive for flank pain. Negative for dysuria.  Musculoskeletal:  Positive for arthralgias and myalgias. Negative for back pain and neck pain.  Neurological:  Negative for dizziness, weakness and headaches.   all other systems are negative except as noted in the HPI and PMH.    Physical Exam Updated Vital Signs BP 126/82 (BP Location: Right Arm)   Pulse 69   Temp 98.2 F (36.8 C) (Oral)   Resp 18   Ht '5\' 7"'$  (1.702 m)   Wt 77.1 kg   SpO2 100%   BMI 26.63 kg/m  Physical Exam Vitals and nursing note  reviewed.  Constitutional:      General: She is not in acute distress.    Appearance: She is well-developed.  HENT:     Head: Normocephalic and atraumatic.     Mouth/Throat:     Pharynx: No oropharyngeal exudate.  Eyes:     Conjunctiva/sclera: Conjunctivae normal.     Pupils: Pupils are equal, round, and reactive to light.  Neck:     Comments: No midline C spine pain Cardiovascular:     Rate and Rhythm: Normal rate and regular rhythm.     Heart sounds: Normal heart sounds. No murmur heard. Pulmonary:     Effort: Pulmonary effort is normal. No respiratory distress.     Breath sounds: Normal breath sounds.  Chest:     Chest wall: No tenderness.  Abdominal:     Palpations: Abdomen is soft.     Tenderness:  There is no abdominal tenderness. There is no guarding or rebound.  Musculoskeletal:        General: Tenderness, deformity and signs of injury present.     Cervical back: Normal range of motion and neck supple.     Comments: Tenderness to right proximal humerus and shoulder joint.  No obvious dislocation.  Clavicle appears to be intact. Intact radial pulse and cardinal hand movements.  Pain with range of motion of fingers and wrist on the right Unable to straighten elbow due to pain  Paraspinal lumbar tenderness but no midline L-spine tenderness.  Skin:    General: Skin is warm.  Neurological:     Mental Status: She is alert and oriented to person, place, and time.     Cranial Nerves: No cranial nerve deficit.     Motor: No abnormal muscle tone.     Coordination: Coordination normal.     Comments:  5/5 strength throughout. CN 2-12 intact.Equal grip strength.   Psychiatric:        Behavior: Behavior normal.     ED Results / Procedures / Treatments   Labs (all labs ordered are listed, but only abnormal results are displayed) Labs Reviewed - No data to display  EKG None  Radiology No results found.  Procedures Procedures  {Document cardiac monitor, telemetry assessment procedure when appropriate:1}  Medications Ordered in ED Medications  HYDROcodone-acetaminophen (NORCO/VICODIN) 5-325 MG per tablet 1 tablet (has no administration in time range)    ED Course/ Medical Decision Making/ A&P                           Medical Decision Making Amount and/or Complexity of Data Reviewed Labs: ordered. Decision-making details documented in ED Course. Radiology: ordered and independent interpretation performed. Decision-making details documented in ED Course. ECG/medicine tests: ordered and independent interpretation performed. Decision-making details documented in ED Course.  Risk Prescription drug management.   Fall with right shoulder and upper arm injury.  No head injury.   Distal pulses intact.  No blood thinner use.  Suspect likely proximal humerus fracture.  Less likely shoulder dislocation  X-ray shows acute displaced femoral neck fracture with displacement. Results reviewed and interpreted by me  Patient's daughter has already spoken to Dr. Lorin Mercy of orthopedics who is a family friend.  He agrees with shoulder immobilizer and follow-up in the office with Dr. Marlou Sa this week.  D/w Dr. Lucia Gaskins of orthopedics.  Agrees with immobilization, pain control and outpatient follow-up. {Document critical care time when appropriate:1} {Document review of labs and clinical decision tools ie heart  score, Chads2Vasc2 etc:1}  {Document your independent review of radiology images, and any outside records:1} {Document your discussion with family members, caretakers, and with consultants:1} {Document social determinants of health affecting pt's care:1} {Document your decision making why or why not admission, treatments were needed:1} Final Clinical Impression(s) / ED Diagnoses Final diagnoses:  None    Rx / DC Orders ED Discharge Orders     None

## 2021-10-07 NOTE — ED Triage Notes (Signed)
Pt reports with right arm, and right shoulder pain, along with right side pain after falling 1.5 hrs ago. Pt is holding her right arm and states that it hurts to move it.

## 2021-10-08 ENCOUNTER — Ambulatory Visit: Payer: Federal, State, Local not specified - PPO | Admitting: Orthopedic Surgery

## 2021-10-08 ENCOUNTER — Encounter (HOSPITAL_COMMUNITY): Payer: Self-pay | Admitting: Orthopedic Surgery

## 2021-10-08 ENCOUNTER — Other Ambulatory Visit (HOSPITAL_BASED_OUTPATIENT_CLINIC_OR_DEPARTMENT_OTHER): Payer: Self-pay

## 2021-10-08 ENCOUNTER — Encounter: Payer: Self-pay | Admitting: Orthopedic Surgery

## 2021-10-08 DIAGNOSIS — S42291A Other displaced fracture of upper end of right humerus, initial encounter for closed fracture: Secondary | ICD-10-CM

## 2021-10-08 NOTE — Progress Notes (Signed)
Office Visit Note   Patient: Alyssa Estrada           Date of Birth: 1960/05/17           MRN: [id number] Visit Date: 10/08/2021 Requested by: Carol Ada, MD [place],  [place] [address] PCP: Carol Ada, MD  Subjective: Chief Complaint  Patient presents with   Right Shoulder - Injury    HPI: Alyssa Estrada is a 61 year old patient with right shoulder pain.  Date of injury 10/07/2021.  Had a fall from a ladder and hit the concrete.  She is right-hand dominant.  Taking Norco for pain.  Reports some pain in the lower back as well but no radicular symptoms.  Had work-up in the emergency department.              ROS: All systems reviewed are negative as they relate to the chief complaint within the history of present illness.  Patient denies  fevers or chills.   Assessment & Plan: Visit Diagnoses:  1. Other closed displaced fracture of proximal end of right humerus, initial encounter     Plan: Impression is 2 part displaced proximal humerus fracture.  Plan is open reduction internal fixation.  Risk and benefits are discussed with the patient including shoulder stiffness infection nerve vessel damage as well as the possibility of avascular necrosis which would require further surgery in the form of reverse shoulder replacement.  Patient stands risk and benefits.  Plan potentially to use CPM machine about 1 week after surgery to facilitate range of motion return.  All questions answered.  Patient also is reporting some low back pain.  Plan for radiographs in preop holding tomorrow.  Follow-Up Instructions: No follow-ups on file.   Orders:  No orders of the defined types were placed in this encounter.  No orders of the defined types were placed in this encounter.     Procedures: No procedures performed   Clinical Data: No additional findings.  Objective: Vital Signs: There were no vitals taken for this visit.  Physical Exam:   Constitutional: Patient  appears well-developed HEENT:  Head: Normocephalic Eyes:EOM are normal Neck: Normal range of motion Cardiovascular: Normal rate Pulmonary/chest: Effort normal Neurologic: Patient is alert Skin: Skin is warm Psychiatric: Patient has normal mood and affect   Ortho Exam: Ortho exam demonstrates full active and passive range of motion of the left arm as well as both legs.  She does have functional deltoid on the right with no paresthesias in the axillary distribution.  Radial pulse intact on the right-hand side.  Grip EPL FPL interosseous strength intact.  Swelling is present in the right shoulder girdle region as expected.  Specialty Comments:  No specialty comments available.  Imaging: DG Hip Unilat W or Wo Pelvis 2-3 Views Right  Result Date: 10/07/2021 CLINICAL DATA:  Status post fall. EXAM: DG HIP (WITH OR WITHOUT PELVIS) 2-3V RIGHT COMPARISON:  None Available. FINDINGS: There is no evidence of an acute hip fracture or dislocation. There is no evidence of arthropathy or other focal bone abnormality. IMPRESSION: Negative. Electronically Signed   By: Virgina Norfolk M.D.   On: 10/07/2021 21:30   DG Ribs Unilateral W/Chest Right  Result Date: 10/07/2021 CLINICAL DATA:  Status post fall. EXAM: RIGHT RIBS AND CHEST - 3+ VIEW COMPARISON:  None Available. FINDINGS: There is an acute, nondisplaced tenth right rib fracture. An acute, displaced fracture deformity is seen involving the head and surgical neck of the proximal  right humerus. There is no evidence of dislocation there is no evidence of pneumothorax or pleural effusion. Both lungs are clear. Heart size and mediastinal contours are within normal limits. IMPRESSION: 1. Acute, fracture of the proximal right humerus and tenth right rib. 2. No acute cardiopulmonary disease. Electronically Signed   By: Virgina Norfolk M.D.   On: 10/07/2021 21:29   DG Humerus Right  Result Date: 10/07/2021 CLINICAL DATA:  Status post fall. EXAM: RIGHT  HUMERUS - 2+ VIEW COMPARISON:  None Available. FINDINGS: An acute fracture deformity is seen involving the head and surgical neck of the proximal right humerus. Approximately 1 shaft width medial displacement of the distal fracture site is seen. There is no evidence of dislocation. Lateral soft tissue swelling is noted. IMPRESSION: Acute fracture of the proximal right humerus. Electronically Signed   By: Virgina Norfolk M.D.   On: 10/07/2021 21:27   DG Shoulder Right  Result Date: 10/07/2021 CLINICAL DATA:  Status post fall. EXAM: RIGHT SHOULDER - 2+ VIEW COMPARISON:  None Available. FINDINGS: An acute, comminuted fracture deformity is seen involving the head and surgical neck of the proximal right humerus. Approximately 1 shaft width medial displacement of the distal fracture site is noted. There is no evidence of dislocation. Lateral soft tissue swelling is present. IMPRESSION: Acute, comminuted fracture deformity involving the head and surgical neck of the proximal right humerus. Electronically Signed   By: Virgina Norfolk M.D.   On: 10/07/2021 21:26     PMFS History: There are no problems to display for this patient.  Past Medical History:  Diagnosis Date   Allergy    Hyperlipidemia    Osteoporosis    osteopenia   Thyroid disease     Family History  Problem Relation Age of Onset   Pancreatic cancer Father    Pancreatic cancer Paternal Grandmother    Colon cancer Neg Hx    Colon polyps Neg Hx    Esophageal cancer Neg Hx    Stomach cancer Neg Hx    Rectal cancer Neg Hx     Past Surgical History:  Procedure Laterality Date   TONSILLECTOMY     Social History   Occupational History   Not on file  Tobacco Use   Smoking status: Never   Smokeless tobacco: Never  Vaping Use   Vaping Use: Never used  Substance and Sexual Activity   Alcohol use: Yes    Comment: once every couple of months/social   Drug use: No   Sexual activity: Not on file

## 2021-10-08 NOTE — Progress Notes (Signed)
PCP - [facility]  Cardiologist - Denies  EP- Denies  Endocrine- Denies  Pulm- Denies  Chest x-ray - Denies  EKG - Denies  Stress Test - Denies  ECHO - Denies  Cardiac Cath - Denies  AICD-na PM-na LOOP-na  Nerve Stimulator- Denies  Dialysis- Denies  Sleep Study - Denies CPAP - Denies  LABS- 10/09/21: CBC  ASA- Denies  ERAS- Yes, clears until 1350  HA1C- Denies  Anesthesia- No  Pt denies having chest pain, sob, or fever during the pre-op phone call. All instructions explained to the pt, with a verbal understanding of the material including: as of today,  stop taking all Aspirin (unless instructed by your doctor) and Other Aspirin containing products, Vitamins, Fish oils, and Herbal medications. Also stop all NSAIDS i.e. Advil, Ibuprofen, Motrin, Aleve, Anaprox, Naproxen, BC, Goody Powders, and all Supplements.  Pt also instructed to wear a mask and social distance if she goes out. The opportunity to ask questions was provided.

## 2021-10-09 ENCOUNTER — Other Ambulatory Visit: Payer: Self-pay

## 2021-10-09 ENCOUNTER — Encounter (HOSPITAL_COMMUNITY): Payer: Self-pay | Admitting: Orthopedic Surgery

## 2021-10-09 ENCOUNTER — Ambulatory Visit (HOSPITAL_COMMUNITY): Payer: Federal, State, Local not specified - PPO

## 2021-10-09 ENCOUNTER — Ambulatory Visit (HOSPITAL_COMMUNITY)
Admission: RE | Admit: 2021-10-09 | Discharge: 2021-10-09 | Disposition: A | Discharge status: Home / Self Care | Payer: Federal, State, Local not specified - PPO | Attending: Orthopedic Surgery | Admitting: Orthopedic Surgery

## 2021-10-09 ENCOUNTER — Ambulatory Visit (HOSPITAL_COMMUNITY): Payer: Federal, State, Local not specified - PPO | Admitting: Anesthesiology

## 2021-10-09 ENCOUNTER — Encounter (HOSPITAL_COMMUNITY)
Admission: RE | Disposition: A | Discharge status: Home / Self Care | Payer: Self-pay | Source: Home / Self Care | Attending: Orthopedic Surgery

## 2021-10-09 DIAGNOSIS — S42201A Unspecified fracture of upper end of right humerus, initial encounter for closed fracture: Secondary | ICD-10-CM | POA: Insufficient documentation

## 2021-10-09 DIAGNOSIS — G8918 Other acute postprocedural pain: Secondary | ICD-10-CM | POA: Diagnosis not present

## 2021-10-09 DIAGNOSIS — W11XXXA Fall on and from ladder, initial encounter: Secondary | ICD-10-CM | POA: Diagnosis not present

## 2021-10-09 DIAGNOSIS — E039 Hypothyroidism, unspecified: Secondary | ICD-10-CM | POA: Insufficient documentation

## 2021-10-09 DIAGNOSIS — Z01818 Encounter for other preprocedural examination: Secondary | ICD-10-CM

## 2021-10-09 DIAGNOSIS — S42291A Other displaced fracture of upper end of right humerus, initial encounter for closed fracture: Secondary | ICD-10-CM | POA: Diagnosis not present

## 2021-10-09 DIAGNOSIS — M25511 Pain in right shoulder: Secondary | ICD-10-CM | POA: Diagnosis not present

## 2021-10-09 DIAGNOSIS — M545 Low back pain, unspecified: Secondary | ICD-10-CM

## 2021-10-09 DIAGNOSIS — S42351D Displaced comminuted fracture of shaft of humerus, right arm, subsequent encounter for fracture with routine healing: Secondary | ICD-10-CM | POA: Diagnosis not present

## 2021-10-09 DIAGNOSIS — S42291D Other displaced fracture of upper end of right humerus, subsequent encounter for fracture with routine healing: Secondary | ICD-10-CM

## 2021-10-09 DIAGNOSIS — S42201D Unspecified fracture of upper end of right humerus, subsequent encounter for fracture with routine healing: Secondary | ICD-10-CM

## 2021-10-09 HISTORY — PX: ORIF HUMERUS FRACTURE: SHX2126

## 2021-10-09 LAB — BASIC METABOLIC PANEL
Anion gap: 10 (ref 5–15)
BUN: 13 mg/dL (ref 8–23)
CO2: 23 mmol/L (ref 22–32)
Calcium: 8.9 mg/dL (ref 8.9–10.3)
Chloride: 105 mmol/L (ref 98–111)
Creatinine, Ser: 0.95 mg/dL (ref 0.44–1.00)
GFR, Estimated: >60 mL/min (ref >60)
Glucose, Bld: 103 mg/dL — ABNORMAL HIGH (ref 70–99)
Potassium: 3.9 mmol/L (ref 3.5–5.1)
Sodium: 138 mmol/L (ref 135–145)

## 2021-10-09 LAB — CBC
HCT: 38.7 % (ref 36.0–46.0)
Hemoglobin: 12.5 g/dL (ref 12.0–15.0)
MCH: 31.7 pg (ref 26.0–34.0)
MCHC: 32.3 g/dL (ref 30.0–36.0)
MCV: 98.2 fL (ref 80.0–100.0)
Platelets: 224 10*3/uL (ref 150–400)
RBC: 3.94 MIL/uL (ref 3.87–5.11)
RDW: 12.1 % (ref 11.5–15.5)
WBC: 7.8 10*3/uL (ref 4.0–10.5)
nRBC: 0 % (ref 0.0–0.2)

## 2021-10-09 SURGERY — OPEN REDUCTION INTERNAL FIXATION (ORIF) PROXIMAL HUMERUS FRACTURE
Anesthesia: General | Laterality: Right

## 2021-10-09 MED ORDER — DEXMEDETOMIDINE (PRECEDEX) IN NS 20 MCG/5ML (4 MCG/ML) IV SYRINGE
PREFILLED_SYRINGE | INTRAVENOUS | Status: DC | PRN
  Administered 2021-10-09: 8 ug via INTRAVENOUS

## 2021-10-09 MED ORDER — MIDAZOLAM HCL 2 MG/2ML IJ SOLN
INTRAMUSCULAR | Status: AC
  Administered 2021-10-09: 2 mg via INTRAVENOUS
  Filled 2021-10-09: qty 2

## 2021-10-09 MED ORDER — ONDANSETRON HCL 4 MG/2ML IJ SOLN
INTRAMUSCULAR | Status: DC | PRN
  Administered 2021-10-09: 8 mg via INTRAVENOUS

## 2021-10-09 MED ORDER — PHENYLEPHRINE HCL-NACL 20-0.9 MG/250ML-% IV SOLN
INTRAVENOUS | Status: DC | PRN
  Administered 2021-10-09: 30 ug/min via INTRAVENOUS

## 2021-10-09 MED ORDER — LIDOCAINE 2% (20 MG/ML) 5 ML SYRINGE
INTRAMUSCULAR | Status: AC
  Filled 2021-10-09: qty 10

## 2021-10-09 MED ORDER — DEXMEDETOMIDINE HCL IN NACL 80 MCG/20ML IV SOLN
INTRAVENOUS | Status: AC
  Filled 2021-10-09: qty 20

## 2021-10-09 MED ORDER — HYDROMORPHONE HCL 1 MG/ML IJ SOLN: 0.2500 mg | INTRAMUSCULAR | Status: DC | PRN

## 2021-10-09 MED ORDER — OXYCODONE HCL 5 MG PO TABS: 5.0000 mg | ORAL_TABLET | Freq: Once | ORAL | Status: DC | PRN

## 2021-10-09 MED ORDER — CEFAZOLIN SODIUM-DEXTROSE 2-4 GM/100ML-% IV SOLN
2.0000 g | INTRAVENOUS | Status: AC
  Administered 2021-10-09: 2 g via INTRAVENOUS
  Filled 2021-10-09: qty 100

## 2021-10-09 MED ORDER — POVIDONE-IODINE 7.5 % EX SOLN: Freq: Once | CUTANEOUS | Status: DC

## 2021-10-09 MED ORDER — ACETAMINOPHEN 500 MG PO TABS
1000.0000 mg | ORAL_TABLET | Freq: Once | ORAL | Status: AC
  Administered 2021-10-09: 1000 mg via ORAL
  Filled 2021-10-09: qty 2

## 2021-10-09 MED ORDER — ONDANSETRON HCL 4 MG/2ML IJ SOLN
INTRAMUSCULAR | Status: AC
  Filled 2021-10-09: qty 8

## 2021-10-09 MED ORDER — LIDOCAINE 2% (20 MG/ML) 5 ML SYRINGE
INTRAMUSCULAR | Status: DC | PRN
  Administered 2021-10-09: 40 mg via INTRAVENOUS

## 2021-10-09 MED ORDER — CHLORHEXIDINE GLUCONATE 0.12 % MT SOLN
15.0000 mL | OROMUCOSAL | Status: AC
  Administered 2021-10-09: 15 mL via OROMUCOSAL
  Filled 2021-10-09 (×2): qty 15

## 2021-10-09 MED ORDER — MIDAZOLAM HCL 2 MG/2ML IJ SOLN: 2.0000 mg | Freq: Once | INTRAMUSCULAR | Status: AC

## 2021-10-09 MED ORDER — LACTATED RINGERS IV SOLN: INTRAVENOUS | Status: DC

## 2021-10-09 MED ORDER — DEXAMETHASONE SODIUM PHOSPHATE 10 MG/ML IJ SOLN: INTRAMUSCULAR | Status: DC | PRN

## 2021-10-09 MED ORDER — ONDANSETRON HCL 4 MG/2ML IJ SOLN
INTRAMUSCULAR | Status: DC | PRN
  Administered 2021-10-09: 4 mg via INTRAMUSCULAR

## 2021-10-09 MED ORDER — PROMETHAZINE HCL 25 MG/ML IJ SOLN: 6.2500 mg | INTRAMUSCULAR | Status: DC | PRN

## 2021-10-09 MED ORDER — 0.9 % SODIUM CHLORIDE (POUR BTL) OPTIME
TOPICAL | Status: DC | PRN
  Administered 2021-10-09: 3000 mL

## 2021-10-09 MED ORDER — FENTANYL CITRATE (PF) 100 MCG/2ML IJ SOLN
INTRAMUSCULAR | Status: AC
  Administered 2021-10-09: 100 ug via INTRAVENOUS
  Filled 2021-10-09: qty 2

## 2021-10-09 MED ORDER — VANCOMYCIN HCL 1000 MG IV SOLR
INTRAVENOUS | Status: DC | PRN
  Administered 2021-10-09: 1000 mg via TOPICAL

## 2021-10-09 MED ORDER — FENTANYL CITRATE (PF) 100 MCG/2ML IJ SOLN: 100.0000 ug | Freq: Once | INTRAMUSCULAR | Status: DC

## 2021-10-09 MED ORDER — DEXAMETHASONE SODIUM PHOSPHATE 10 MG/ML IJ SOLN
INTRAMUSCULAR | Status: DC | PRN
  Administered 2021-10-09: 10 mg via INTRAVENOUS

## 2021-10-09 MED ORDER — SUGAMMADEX SODIUM 200 MG/2ML IV SOLN
INTRAVENOUS | Status: DC | PRN
  Administered 2021-10-09: 300 mg via INTRAVENOUS

## 2021-10-09 MED ORDER — MIDAZOLAM HCL 2 MG/2ML IJ SOLN
INTRAMUSCULAR | Status: DC | PRN
  Administered 2021-10-09: 1 mg via INTRAVENOUS

## 2021-10-09 MED ORDER — GLYCOPYRROLATE PF 0.2 MG/ML IJ SOSY
PREFILLED_SYRINGE | INTRAMUSCULAR | Status: AC
  Filled 2021-10-09: qty 1

## 2021-10-09 MED ORDER — TRANEXAMIC ACID-NACL 1000-0.7 MG/100ML-% IV SOLN
1000.0000 mg | INTRAVENOUS | Status: AC
  Administered 2021-10-09: 1000 mg via INTRAVENOUS
  Filled 2021-10-09: qty 100

## 2021-10-09 MED ORDER — FENTANYL CITRATE (PF) 100 MCG/2ML IJ SOLN: 100.0000 ug | Freq: Once | INTRAMUSCULAR | Status: AC

## 2021-10-09 MED ORDER — BUPIVACAINE LIPOSOME 1.3 % IJ SUSP
INTRAMUSCULAR | Status: DC | PRN
  Administered 2021-10-09: 10 mL via PERINEURAL

## 2021-10-09 MED ORDER — BUPIVACAINE-EPINEPHRINE (PF) 0.5% -1:200000 IJ SOLN
INTRAMUSCULAR | Status: DC | PRN
  Administered 2021-10-09: 10 mL via PERINEURAL

## 2021-10-09 MED ORDER — PROPOFOL 500 MG/50ML IV EMUL
INTRAVENOUS | Status: DC | PRN
  Administered 2021-10-09: 25 ug/kg/min via INTRAVENOUS

## 2021-10-09 MED ORDER — MIDAZOLAM HCL 2 MG/2ML IJ SOLN
INTRAMUSCULAR | Status: AC
Start: 2021-10-09 — End: ?
  Filled 2021-10-09: qty 2

## 2021-10-09 MED ORDER — SCOPOLAMINE 1 MG/3DAYS TD PT72
1.0000 | MEDICATED_PATCH | TRANSDERMAL | Status: DC
  Administered 2021-10-09: 1.5 mg via TRANSDERMAL
  Filled 2021-10-09: qty 1

## 2021-10-09 MED ORDER — OXYCODONE-ACETAMINOPHEN 5-325 MG PO TABS: 1.0000 | ORAL_TABLET | ORAL | 0 refills | Status: DC | PRN

## 2021-10-09 MED ORDER — OXYCODONE HCL 5 MG/5ML PO SOLN: 5.0000 mg | Freq: Once | ORAL | Status: DC | PRN

## 2021-10-09 MED ORDER — OXYCODONE HCL 5 MG PO TABS
5.0000 mg | ORAL_TABLET | ORAL | 0 refills | Status: DC | PRN
Start: 2021-10-09 — End: 2022-01-12

## 2021-10-09 MED ORDER — MEPERIDINE HCL 25 MG/ML IJ SOLN: 6.2500 mg | INTRAMUSCULAR | Status: DC | PRN

## 2021-10-09 MED ORDER — VANCOMYCIN HCL 1000 MG IV SOLR
INTRAVENOUS | Status: AC
  Filled 2021-10-09: qty 20

## 2021-10-09 MED ORDER — PROPOFOL 10 MG/ML IV BOLUS
INTRAVENOUS | Status: DC | PRN
  Administered 2021-10-09: 50 mg via INTRAVENOUS
  Administered 2021-10-09: 150 mg via INTRAVENOUS

## 2021-10-09 MED ORDER — DEXAMETHASONE SODIUM PHOSPHATE 10 MG/ML IJ SOLN
INTRAMUSCULAR | Status: AC
  Filled 2021-10-09: qty 3

## 2021-10-09 MED ORDER — POVIDONE-IODINE 10 % EX SWAB: 2.0000 | Freq: Once | CUTANEOUS | Status: DC

## 2021-10-09 MED ORDER — MIDAZOLAM HCL 2 MG/2ML IJ SOLN: 2.0000 mg | Freq: Once | INTRAMUSCULAR | Status: DC

## 2021-10-09 MED ORDER — METHOCARBAMOL 500 MG PO TABS: 500.0000 mg | ORAL_TABLET | Freq: Three times a day (TID) | ORAL | 1 refills | Status: DC | PRN

## 2021-10-09 MED ORDER — EPHEDRINE SULFATE-NACL 50-0.9 MG/10ML-% IV SOSY
PREFILLED_SYRINGE | INTRAVENOUS | Status: DC | PRN
  Administered 2021-10-09: 5 mg via INTRAVENOUS
  Administered 2021-10-09 (×2): 10 mg via INTRAVENOUS

## 2021-10-09 MED ORDER — ROCURONIUM BROMIDE 10 MG/ML (PF) SYRINGE
PREFILLED_SYRINGE | INTRAVENOUS | Status: DC | PRN
  Administered 2021-10-09: 60 mg via INTRAVENOUS
  Administered 2021-10-09: 40 mg via INTRAVENOUS

## 2021-10-09 MED ORDER — ROCURONIUM BROMIDE 10 MG/ML (PF) SYRINGE
PREFILLED_SYRINGE | INTRAVENOUS | Status: AC
Start: 2021-10-09 — End: ?
  Filled 2021-10-09: qty 20

## 2021-10-09 MED ORDER — MIDAZOLAM HCL 2 MG/2ML IJ SOLN: 0.5000 mg | Freq: Once | INTRAMUSCULAR | Status: DC | PRN

## 2021-10-09 MED ORDER — EPHEDRINE 5 MG/ML INJ
INTRAVENOUS | Status: AC
  Filled 2021-10-09: qty 10

## 2021-10-09 SURGICAL SUPPLY — 79 items
ALCOHOL 70% 16 OZ (MISCELLANEOUS) ×2 IMPLANT
BAG COUNTER SPONGE SURGICOUNT (BAG) ×2 IMPLANT
BENZOIN TINCTURE PRP APPL 2/3 (GAUZE/BANDAGES/DRESSINGS) ×1 IMPLANT
BIT DRILL 3.2 (BIT) ×2
BIT DRILL 3.2XCALB NS DISP (BIT) IMPLANT
BIT DRILL CALIBRATED 2.7 (BIT) ×1 IMPLANT
BIT DRL 3.2XCALB NS DISP (BIT) ×1
BNDG COHESIVE 4X5 TAN STRL (GAUZE/BANDAGES/DRESSINGS) ×1 IMPLANT
BNDG ELASTIC 4X5.8 VLCR STR LF (GAUZE/BANDAGES/DRESSINGS) IMPLANT
BNDG ELASTIC 6X5.8 VLCR STR LF (GAUZE/BANDAGES/DRESSINGS) IMPLANT
CHLORAPREP W/TINT 26 (MISCELLANEOUS) ×4 IMPLANT
CLSR STERI-STRIP ANTIMIC 1/2X4 (GAUZE/BANDAGES/DRESSINGS) ×1 IMPLANT
COOLER ICEMAN CLASSIC (MISCELLANEOUS) ×1 IMPLANT
COVER SURGICAL LIGHT HANDLE (MISCELLANEOUS) ×2 IMPLANT
DRAIN PENROSE 1/2X12 LTX STRL (WOUND CARE) ×1 IMPLANT
DRAPE C-ARM 42X72 X-RAY (DRAPES) ×1 IMPLANT
DRAPE IMP U-DRAPE 54X76 (DRAPES) ×2 IMPLANT
DRAPE U-SHAPE 47X51 STRL (DRAPES) ×4 IMPLANT
DRESSING AQUACEL AG SP 3.5X10 (GAUZE/BANDAGES/DRESSINGS) IMPLANT
DRSG AQUACEL AG SP 3.5X10 (GAUZE/BANDAGES/DRESSINGS) ×2
DRSG PAD ABDOMINAL 8X10 ST (GAUZE/BANDAGES/DRESSINGS) IMPLANT
ELECT REM PT RETURN 9FT ADLT (ELECTROSURGICAL) ×2
ELECTRODE REM PT RTRN 9FT ADLT (ELECTROSURGICAL) ×1 IMPLANT
FACESHIELD WRAPAROUND (MASK) IMPLANT
FACESHIELD WRAPAROUND OR TEAM (MASK) ×1 IMPLANT
GAUZE SPONGE 4X4 12PLY STRL (GAUZE/BANDAGES/DRESSINGS) ×2 IMPLANT
GAUZE XEROFORM 5X9 LF (GAUZE/BANDAGES/DRESSINGS) IMPLANT
GLOVE BIOGEL PI IND STRL 8 (GLOVE) ×1 IMPLANT
GLOVE BIOGEL PI INDICATOR 8 (GLOVE) ×1
GLOVE ECLIPSE 8.0 STRL XLNG CF (GLOVE) ×3 IMPLANT
GOWN STRL REUS W/ TWL LRG LVL3 (GOWN DISPOSABLE) ×2 IMPLANT
GOWN STRL REUS W/ TWL XL LVL3 (GOWN DISPOSABLE) ×1 IMPLANT
GOWN STRL REUS W/TWL LRG LVL3 (GOWN DISPOSABLE) ×4
GOWN STRL REUS W/TWL XL LVL3 (GOWN DISPOSABLE) ×2
HYDROGEN PEROXIDE 16OZ (MISCELLANEOUS) ×2 IMPLANT
K-WIRE 2X5 SS THRDED S3 (WIRE) ×6
KIT BASIN OR (CUSTOM PROCEDURE TRAY) ×2 IMPLANT
KIT TURNOVER KIT B (KITS) ×2 IMPLANT
KWIRE 2X5 SS THRDED S3 (WIRE) IMPLANT
MANIFOLD NEPTUNE II (INSTRUMENTS) ×2 IMPLANT
NS IRRIG 1000ML POUR BTL (IV SOLUTION) ×2 IMPLANT
PACK SHOULDER (CUSTOM PROCEDURE TRAY) ×2 IMPLANT
PAD ARMBOARD 7.5X6 YLW CONV (MISCELLANEOUS) ×4 IMPLANT
PAD CAST 4YDX4 CTTN HI CHSV (CAST SUPPLIES) IMPLANT
PAD COLD SHLDR WRAP-ON (PAD) ×1 IMPLANT
PADDING CAST COTTON 4X4 STRL (CAST SUPPLIES)
PEG LOCKING 3.2MMX26MM (Peg) ×1 IMPLANT
PEG LOCKING 3.2X34 (Screw) ×2 IMPLANT
PEG LOCKING 3.2X36 (Screw) ×2 IMPLANT
PEG LOCKING 3.2X38 (Screw) ×1 IMPLANT
PEG LOCKING 3.2X40 (Peg) ×2 IMPLANT
PEG LOCKING 3.2X48 (Peg) ×1 IMPLANT
PEG LOCKING 3.2X52 (Peg) ×2 IMPLANT
PLATE PROX HUM HI R 3H 80 (Plate) ×1 IMPLANT
PUTTY DBM STAGRAFT PLUS 5CC (Putty) ×1 IMPLANT
SCREW LOCK CORT STAR 3.5X22 (Screw) ×2 IMPLANT
SCREW LP NL T15 3.5X22 (Screw) ×1 IMPLANT
SLEEVE MEASURING 3.2 (BIT) ×1 IMPLANT
SLING ARM IMMOBILIZER XL (CAST SUPPLIES) ×1 IMPLANT
SPONGE T-LAP 18X18 ~~LOC~~+RFID (SPONGE) ×2 IMPLANT
SPONGE T-LAP 4X18 ~~LOC~~+RFID (SPONGE) IMPLANT
STAPLER VISISTAT 35W (STAPLE) IMPLANT
SUCTION FRAZIER HANDLE 10FR (MISCELLANEOUS)
SUCTION TUBE FRAZIER 10FR DISP (MISCELLANEOUS) IMPLANT
SUT MAXBRAID (SUTURE) ×2 IMPLANT
SUT MON AB 3-0 SH 27 (SUTURE) ×2
SUT MON AB 3-0 SH27 (SUTURE) IMPLANT
SUT VIC AB 0 CT1 27 (SUTURE) ×4
SUT VIC AB 0 CT1 27XBRD ANBCTR (SUTURE) IMPLANT
SUT VIC AB 1 CT1 27 (SUTURE) ×8
SUT VIC AB 1 CT1 27XBRD ANBCTR (SUTURE) IMPLANT
SUT VIC AB 2-0 CT1 27 (SUTURE) ×4
SUT VIC AB 2-0 CT1 TAPERPNT 27 (SUTURE) IMPLANT
SUT VIC AB 2-0 CTB1 (SUTURE) IMPLANT
SUT VICRYL 0 UR6 27IN ABS (SUTURE) ×3 IMPLANT
TOWEL GREEN STERILE (TOWEL DISPOSABLE) ×2 IMPLANT
TOWEL GREEN STERILE FF (TOWEL DISPOSABLE) ×2 IMPLANT
WATER STERILE IRR 1000ML POUR (IV SOLUTION) ×2 IMPLANT
YANKAUER SUCT BULB TIP NO VENT (SUCTIONS) IMPLANT

## 2021-10-09 NOTE — Anesthesia Preprocedure Evaluation (Addendum)
Anesthesia Evaluation  Patient identified by MRN, date of birth, ID band Patient awake    Reviewed: Allergy & Precautions, NPO status , Patient's Chart, lab work & pertinent test results  History of Anesthesia Complications Negative for: history of anesthetic complications  Airway Mallampati: II  TM Distance: >3 FB Neck ROM: Full    Dental  (+) Dental Advisory Given, Teeth Intact   Pulmonary neg pulmonary ROS,    breath sounds clear to auscultation       Cardiovascular negative cardio ROS   Rhythm:Regular Rate:Normal     Neuro/Psych negative neurological ROS     GI/Hepatic negative GI ROS, Neg liver ROS,   Endo/Other  Hypothyroidism   Renal/GU negative Renal ROS     Musculoskeletal   Abdominal   Peds  Hematology negative hematology ROS (+)   Anesthesia Other Findings   Reproductive/Obstetrics                            Anesthesia Physical Anesthesia Plan  ASA: 2  Anesthesia Plan: General   Post-op Pain Management: Regional block* and Tylenol PO (pre-op)*   Induction: Intravenous  PONV Risk Score and Plan: 3 and Ondansetron, Dexamethasone and Scopolamine patch - Pre-op  Airway Management Planned: Oral ETT  Additional Equipment: None  Intra-op Plan:   Post-operative Plan: Extubation in OR  Informed Consent: I have reviewed the patients History and Physical, chart, labs and discussed the procedure including the risks, benefits and alternatives for the proposed anesthesia with the patient or authorized representative who has indicated his/her understanding and acceptance.     Dental advisory given  Plan Discussed with: CRNA and Surgeon  Anesthesia Plan Comments: (Plan routine monitors, GA with interscalene block for post op analgesia)       Anesthesia Quick Evaluation

## 2021-10-09 NOTE — Anesthesia Procedure Notes (Signed)
Procedure Name: Intubation Date/Time: 10/09/2021 4:45 PM  Performed by: Annye Asa, MDPre-anesthesia Checklist: Patient identified, Emergency Drugs available, Suction available and Patient being monitored Patient Re-evaluated:Patient Re-evaluated prior to induction Oxygen Delivery Method: Circle system utilized Preoxygenation: Pre-oxygenation with 100% oxygen Induction Type: IV induction Ventilation: Mask ventilation without difficulty Laryngoscope Size: Mac and 3 Grade View: Grade II Tube type: Oral Tube size: 7.0 mm Number of attempts: 1 Airway Equipment and Method: Stylet and Oral airway Placement Confirmation: ETT inserted through vocal cords under direct vision, positive ETCO2 and breath sounds checked- equal and bilateral Secured at: 21 cm Tube secured with: Tape Dental Injury: Teeth and Oropharynx as per pre-operative assessment

## 2021-10-09 NOTE — Transfer of Care (Signed)
Immediate Anesthesia Transfer of Care Note  Patient: Alyssa Estrada  Procedure(s) Performed: RIGHT SHOULDER OPEN REDUCTION INTERNAL FIXATION (ORIF) PROXIMAL HUMERUS FRACTURE (Right)  Patient Location: PACU  Anesthesia Type:GA combined with regional for post-op pain  Level of Consciousness: drowsy  Airway & Oxygen Therapy: Patient Spontanous Breathing and Patient connected to face mask oxygen  Post-op Assessment: Report given to RN and Post -op Vital signs reviewed and stable  Post vital signs: Reviewed and stable  Last Vitals:  Vitals Value Taken Time  BP 104/46 10/09/21 1911  Temp 36.8 C 10/09/21 1910  Pulse 77 10/09/21 1912  Resp 17 10/09/21 1912  SpO2 100 % 10/09/21 1912  Vitals shown include unvalidated device data.  Last Pain:  Vitals:   10/09/21 1550  TempSrc:   PainSc: 0-No pain      Patients Stated Pain Goal: 0 (11/88/67 7373)  Complications: No notable events documented.

## 2021-10-09 NOTE — Op Note (Unsigned)
NAME: AKEIA, PEROT MEDICAL RECORD NO: [id number] ACCOUNT NO: [id number] DATE OF BIRTH: 1961-01-06 FACILITY: [facility] LOCATION: [facility] PHYSICIAN: Yetta Barre. Marlou Sa, MD  Operative Report   DATE OF PROCEDURE: 10/09/2021  PREOPERATIVE DIAGNOSIS:  Right proximal humerus fracture, displaced.  POSTOPERATIVE DIAGNOSIS:  Right proximal humerus fracture, displaced.  PROCEDURE:  Right proximal humerus open reduction and internal fixation using Biomet plate.  SURGEON:  Yetta Barre. Marlou Sa, MD  ASSISTANT:  Annie Main, PA  INDICATIONS:  The patient is a 61 year old patient with right shoulder pain.  He presents for operative management after explanation of risks and benefits.  He sustained proximal humerus fracture, displaced significantly about several days ago.  DESCRIPTION OF PROCEDURE:  The patient was brought to the operating room where general anesthetic was induced.  Preoperative antibiotics administered.  Timeout was called.  The patient was placed on the Hana bed with the foot placed in position. Head in  neutral position.  Right shoulder, arm and hand prescrubbed with alcohol and Betadine, allowed to air dry, prepped with DuraPrep solution and draped in sterile manner.  Charlie Pitter was used to cover and seal the operative field.  Timeout was called.   Deltopectoral approach was made.  Skin and subcutaneous tissue sharply divided beginning around the coracoid process extending about 2 cm lateral to the axillary crease.  Cephalic vein mobilized laterally.  Blunt dissection was performed between the  deltoid and pec tendon.  The upper 1.5 cm of the pec tendon was released.  Anterior portion of the deltoid also released to facilitate plate placement. Two Vicryl sutures placed in the rotator cuff region to facilitate head positioning.  Fracture near  the biceps tendon and the bicipital groove were alignment in reduction.  Thorough irrigation was performed to see cortical interdigitation regions.  With  reduction performed, a plate was applied and K-wires were placed.  Plate adjustment was performed to  allow for the inferior calcar screws to achieve maximal and optimal positioning.  Three locking and nonlocking screws placed distally. Pegs placed proximally for fixation.  Under fluoroscopic guidance, the fixation appeared to be very good with no peg  penetration into the articular surface. It should be noted that during the drilling of the pegs, there was some bleeding from the holes indicating now at least some blood supply to the humeral head.  Following fracture fixation, the arm was taken through  range of motion and found to be stable.  Thorough irrigation was performed with 3 liters of irrigating solution.  Vancomycin powder placed.  Deltopectoral interval was closed using  #1 Vicryl suture followed by interrupted inverted 0 Vicryl suture, 2-0  Vicryl suture, and 3-0 Monocryl with Steri-Strips and Aquacel dressing applied.  Shoulder immobilizer applied.  The patient tolerated the procedure well without immediate complication.  Luke's assistance was required at all times for opening, closing,  fracture reduction, limb positioning and drilling.  His assistance was a medical necessity at all times during the case.   VAI D: 10/09/2021 7:08:54 pm T: 10/09/2021 10:10:00 pm  JOB: [id number]/ [id number]

## 2021-10-09 NOTE — Brief Op Note (Signed)
   10/09/2021  7:04 PM  PATIENT:  Alyssa Estrada  61 y.o. female  PRE-OPERATIVE DIAGNOSIS:  right shoulder proximal humerus fracture  POST-OPERATIVE DIAGNOSIS:  right shoulder proximal humerus fracture  PROCEDURE:  Procedure(s): RIGHT SHOULDER OPEN REDUCTION INTERNAL FIXATION (ORIF) PROXIMAL HUMERUS FRACTURE  SURGEON:  Surgeon(s): Cammy Copa, MD  ASSISTANT: magnant pa  ANESTHESIA:   general  EBL: 50 ml    No intake/output data recorded.  BLOOD ADMINISTERED: none  DRAINS: none   LOCAL MEDICATIONS USED:  vanco  SPECIMEN:  No Specimen  COUNTS:  YES  TOURNIQUET:  * No tourniquets in log *  DICTATION: .Other Dictation: Dictation Number [id number]  PLAN OF CARE: Discharge to home after PACU  PATIENT DISPOSITION:  PACU - hemodynamically stable

## 2021-10-09 NOTE — Anesthesia Postprocedure Evaluation (Signed)
Anesthesia Post Note  Patient: OPRAH CAMARENA  Procedure(s) Performed: RIGHT SHOULDER OPEN REDUCTION INTERNAL FIXATION (ORIF) PROXIMAL HUMERUS FRACTURE (Right)     Patient location during evaluation: PACU Anesthesia Type: General and Regional Level of consciousness: awake and alert Pain management: pain level controlled Vital Signs Assessment: post-procedure vital signs reviewed and stable Respiratory status: spontaneous breathing, nonlabored ventilation and respiratory function stable Cardiovascular status: blood pressure returned to baseline and stable Postop Assessment: no apparent nausea or vomiting Anesthetic complications: no   No notable events documented.  Last Vitals:  Vitals:   10/09/21 1925 10/09/21 1940  BP: 116/69 122/71  Pulse: 76 76  Resp: 15 14  Temp:    SpO2: 93% 92%    Last Pain:  Vitals:   10/09/21 1910  TempSrc:   PainSc: 0-No pain                 Saeed Toren,W. EDMOND

## 2021-10-09 NOTE — H&P (Signed)
Alyssa Estrada is an 61 y.o. female.   Chief Complaint: Right shoulder pain HPI: Alyssa Estrada is a 61 year old patient who injured her right shoulder falling from a ladder several days ago.  Reports right shoulder pain.  Denies any other orthopedic complaints.  Does report very mild low back pain.  Denies much in the way of numbness or tingling in her fingers but does report a lot of pain in the shoulder.  Plain radiographs demonstrate significantly displaced proximal humerus fracture with anterior displacement of the shaft approximately 2 shaft widths from the head which remains reduced in the glenoid.  Past Medical History:  Diagnosis Date   Allergy    Hyperlipidemia    Osteoporosis    osteopenia   Thyroid disease     Past Surgical History:  Procedure Laterality Date   TONSILLECTOMY      Family History  Problem Relation Age of Onset   Pancreatic cancer Father    Pancreatic cancer Paternal Grandmother    Colon cancer Neg Hx    Colon polyps Neg Hx    Esophageal cancer Neg Hx    Stomach cancer Neg Hx    Rectal cancer Neg Hx    Social History:  reports that she has never smoked. She has never used smokeless tobacco. She reports current alcohol use. She reports that she does not use drugs.  Allergies:  Allergies  Allergen Reactions   Sulfa Antibiotics Rash    Facility-Administered Medications Prior to Admission  Medication Dose Route Frequency Provider Last Rate Last Admin   0.9 %  sodium chloride infusion  500 mL Intravenous Continuous Milus Banister, MD       Medications Prior to Admission  Medication Sig Dispense Refill   atorvastatin (LIPITOR) 20 MG tablet Take 1 tablet by mouth once daily (Patient taking differently: Take 20 mg by mouth every evening.) 90 tablet 3   Calcium Carb-Cholecalciferol (CALCIUM + D3 PO) Take 1 tablet by mouth in the morning and at bedtime.     cholecalciferol (VITAMIN D) 25 MCG (1000 UNIT) tablet Take 1,000 Units by mouth in the morning and at  bedtime.     fish oil-omega-3 fatty acids 1000 MG capsule Take 2 g by mouth every evening.     HYDROcodone-acetaminophen (NORCO/VICODIN) 5-325 MG tablet Take 2 tablets by mouth every 4 (four) hours as needed. 10 tablet 0   ibuprofen (ADVIL) 200 MG tablet Take 400 mg by mouth every 6 (six) hours as needed (pain.).     levothyroxine (SYNTHROID) 88 MCG tablet Take 1 tablet in the morning on an empty stomach Orally once a day 90 days 90 tablet 3   sodium chloride (MURO 128) 5 % ophthalmic solution Place 1 drop into both eyes 3 (three) times daily as needed (dry/irritated eyes.).      Results for orders placed or performed during the hospital encounter of 10/09/21 (from the past 48 hour(s))  CBC     Status: None   Collection Time: 10/09/21  2:55 PM  Result Value Ref Range   WBC 7.8 4.0 - 10.5 K/uL   RBC 3.94 3.87 - 5.11 MIL/uL   Hemoglobin 12.5 12.0 - 15.0 g/dL   HCT 38.7 36.0 - 46.0 %   MCV 98.2 80.0 - 100.0 fL   MCH 31.7 26.0 - 34.0 pg   MCHC 32.3 30.0 - 36.0 g/dL   RDW 12.1 11.5 - 15.5 %   Platelets 224 150 - 400 K/uL   nRBC 0.0 0.0 - 0.2 %  Comment: Performed at [facility], [place] [address] [place], [place] [address]  Basic metabolic panel     Status: Abnormal   Collection Time: 10/09/21  2:55 PM  Result Value Ref Range   Sodium 138 135 - 145 mmol/L   Potassium 3.9 3.5 - 5.1 mmol/L   Chloride 105 98 - 111 mmol/L   CO2 23 22 - 32 mmol/L   Glucose, Bld 103 (H) 70 - 99 mg/dL    Comment: Glucose reference range applies only to samples taken after fasting for at least 8 hours.   BUN 13 8 - 23 mg/dL   Creatinine, Ser 0.95 0.44 - 1.00 mg/dL   Calcium 8.9 8.9 - 10.3 mg/dL   GFR, Estimated >60 >60 mL/min    Comment: (NOTE) Calculated using the CKD-EPI Creatinine Equation (2021)    Anion gap 10 5 - 15    Comment: Performed at [facility] [address], [place], [place] [address]   DG Lumbar Spine 2-3 Views  Result Date: 10/09/2021 CLINICAL DATA:  Right-sided low  back pain.  No injury. EXAM: LUMBAR SPINE - 2-3 VIEW COMPARISON:  None Available. FINDINGS: There is no evidence of lumbar spine fracture. Mild retrolisthesis at L2-L3 and L3-L4. Moderate to severe disc height loss at L2-L3. Mild disc height loss at L1-L2. Remaining intervertebral disc spaces are relatively preserved. IMPRESSION: 1. Degenerative disc disease, moderate to severe at L2-L3. No acute osseous abnormality. Electronically Signed   By: Titus Dubin M.D.   On: 10/09/2021 15:38   DG Hip Unilat W or Wo Pelvis 2-3 Views Right  Result Date: 10/07/2021 CLINICAL DATA:  Status post fall. EXAM: DG HIP (WITH OR WITHOUT PELVIS) 2-3V RIGHT COMPARISON:  None Available. FINDINGS: There is no evidence of an acute hip fracture or dislocation. There is no evidence of arthropathy or other focal bone abnormality. IMPRESSION: Negative. Electronically Signed   By: Virgina Norfolk M.D.   On: 10/07/2021 21:30   DG Ribs Unilateral W/Chest Right  Result Date: 10/07/2021 CLINICAL DATA:  Status post fall. EXAM: RIGHT RIBS AND CHEST - 3+ VIEW COMPARISON:  None Available. FINDINGS: There is an acute, nondisplaced tenth right rib fracture. An acute, displaced fracture deformity is seen involving the head and surgical neck of the proximal right humerus. There is no evidence of dislocation there is no evidence of pneumothorax or pleural effusion. Both lungs are clear. Heart size and mediastinal contours are within normal limits. IMPRESSION: 1. Acute, fracture of the proximal right humerus and tenth right rib. 2. No acute cardiopulmonary disease. Electronically Signed   By: Virgina Norfolk M.D.   On: 10/07/2021 21:29   DG Humerus Right  Result Date: 10/07/2021 CLINICAL DATA:  Status post fall. EXAM: RIGHT HUMERUS - 2+ VIEW COMPARISON:  None Available. FINDINGS: An acute fracture deformity is seen involving the head and surgical neck of the proximal right humerus. Approximately 1 shaft width medial displacement of the  distal fracture site is seen. There is no evidence of dislocation. Lateral soft tissue swelling is noted. IMPRESSION: Acute fracture of the proximal right humerus. Electronically Signed   By: Virgina Norfolk M.D.   On: 10/07/2021 21:27   DG Shoulder Right  Result Date: 10/07/2021 CLINICAL DATA:  Status post fall. EXAM: RIGHT SHOULDER - 2+ VIEW COMPARISON:  None Available. FINDINGS: An acute, comminuted fracture deformity is seen involving the head and surgical neck of the proximal right humerus. Approximately 1 shaft width medial displacement of the distal fracture  site is noted. There is no evidence of dislocation. Lateral soft tissue swelling is present. IMPRESSION: Acute, comminuted fracture deformity involving the head and surgical neck of the proximal right humerus. Electronically Signed   By: Virgina Norfolk M.D.   On: 10/07/2021 21:26    Review of Systems  Constitutional:  Positive for activity change.  Musculoskeletal:  Positive for arthralgias.  All other systems reviewed and are negative.   Blood pressure 131/78, pulse 77, temperature 98.6 F (37 C), temperature source Oral, resp. rate 18, height '5\' 7"'$  (1.702 m), weight 77.1 kg, SpO2 97 %. Physical Exam Vitals reviewed.  HENT:     Head: Normocephalic.     Nose: Nose normal.     Mouth/Throat:     Mouth: Mucous membranes are moist.  Eyes:     Pupils: Pupils are equal, round, and reactive to light.  Cardiovascular:     Rate and Rhythm: Normal rate.     Pulses: Normal pulses.  Pulmonary:     Effort: Pulmonary effort is normal.  Abdominal:     General: Abdomen is flat.  Musculoskeletal:     Cervical back: Normal range of motion.  Skin:    General: Skin is warm.     Capillary Refill: Capillary refill takes less than 2 seconds.  Neurological:     General: No focal deficit present.     Mental Status: She is alert.  Psychiatric:        Mood and Affect: Mood normal.   Right arm examination demonstrates functional deltoid.   Swelling is present.  Radial pulse intact.  EPL FPL interosseous intact.  Elbow range of motion intact.  Assessment/Plan Impression is displaced right proximal humerus fracture.  Plan is open reduction internal fixation.  The risk and benefits are discussed including not limited to infection or vessel damage avascular necrosis as well as potential need for further surgery.  Patient understands risk and benefits and wishes to proceed.  All questions answered.  Anderson Malta, MD 10/09/2021, 3:48 PM

## 2021-10-09 NOTE — Anesthesia Procedure Notes (Signed)
Anesthesia Regional Block: Interscalene brachial plexus block   Pre-Anesthetic Checklist: , timeout performed,  Correct Patient, Correct Site, Correct Laterality,  Correct Procedure, Correct Position, site marked,  Risks and benefits discussed,  Surgical consent,  Pre-op evaluation,  At surgeon's request and post-op pain management  Laterality: Right and Upper  Prep: chloraprep       Needles:  Injection technique: Single-shot  Needle Type: Echogenic Needle     Needle Length: 9cm  Needle Gauge: 21     Additional Needles:   Procedures:,,,, ultrasound used (permanent image in chart),,    Narrative:  Start time: 10/09/2021 3:29 PM End time: 10/09/2021 3:36 PM Injection made incrementally with aspirations every 5 mL.  Performed by: Personally  Anesthesiologist: Annye Asa, MD  Additional Notes: Pt identified in Holding room.  Monitors applied. Working IV access confirmed. Sterile prep R clavicle and neck.  #21ga ECHOgenic Arrow block needle to interscalene brachial plexus with US guidance.  10cc 0.5% Bupivacaine 1:200k epi and Exparel injected incrementally after negative test dose.  Patient asymptomatic, VSS, no heme aspirated, tolerated well.   Jenita Seashore, MD

## 2021-10-10 ENCOUNTER — Encounter (HOSPITAL_COMMUNITY): Payer: Self-pay | Admitting: Orthopedic Surgery

## 2021-10-12 ENCOUNTER — Telehealth: Payer: Self-pay | Admitting: Orthopedic Surgery

## 2021-10-16 DIAGNOSIS — S42291A Other displaced fracture of upper end of right humerus, initial encounter for closed fracture: Secondary | ICD-10-CM | POA: Diagnosis not present

## 2021-10-17 ENCOUNTER — Ambulatory Visit (INDEPENDENT_AMBULATORY_CARE_PROVIDER_SITE_OTHER): Payer: Federal, State, Local not specified - PPO | Admitting: Orthopedic Surgery

## 2021-10-17 ENCOUNTER — Ambulatory Visit: Payer: Self-pay

## 2021-10-17 DIAGNOSIS — S42291A Other displaced fracture of upper end of right humerus, initial encounter for closed fracture: Secondary | ICD-10-CM

## 2021-10-22 ENCOUNTER — Encounter: Payer: Self-pay | Admitting: Orthopedic Surgery

## 2021-10-22 NOTE — Progress Notes (Signed)
   Post-Op Visit Note   Patient: Alyssa Estrada           Date of Birth: 1960-10-28           MRN: [id number] Visit Date: 10/17/2021 PCP: Carol Ada, MD   Assessment & Plan:  Chief Complaint:  Chief Complaint  Patient presents with   Right Shoulder - Routine Post Op    10/09/21 right proximal humerus ORIF   Visit Diagnoses:  1. Other closed displaced fracture of proximal end of right humerus, initial encounter     Plan: [name] is a 61 year old patient who is now a week out right proximal humerus open reduction internal fixation.  Started CPM yesterday at 52 degrees.  Was having some back and right buttock pain which improved with the muscle relaxer.  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  Plan at this time is to discontinue the sling on July 5.  Continue CPM.  No lifting with that right arm.  3-week return and we will start physical therapy then.  Decision for or against MRI scanning on her back depending on how she is feeling.  Follow-Up Instructions: No follow-ups on file.   Orders:  Orders Placed This Encounter  Procedures   XR Shoulder Right   No orders of the defined types were placed in this encounter.   Imaging: No results found.  PMFS History: There are no problems to display for this patient.  Past Medical History:  Diagnosis Date   Allergy    Hyperlipidemia    Osteoporosis    osteopenia   Thyroid disease     Family History  Problem Relation Age of Onset   Pancreatic cancer Father    Pancreatic cancer Paternal Grandmother    Colon cancer Neg Hx    Colon polyps Neg Hx    Esophageal cancer Neg Hx    Stomach cancer Neg Hx    Rectal cancer Neg Hx     Past Surgical History:  Procedure Laterality Date   ORIF HUMERUS FRACTURE Right 10/09/2021   Procedure: RIGHT SHOULDER OPEN REDUCTION INTERNAL FIXATION (ORIF) PROXIMAL HUMERUS FRACTURE;  Surgeon: Meredith Pel, MD;  Location: [facility];  Service:  Orthopedics;  Laterality: Right;   TONSILLECTOMY     Social History   Occupational History   Not on file  Tobacco Use   Smoking status: Never   Smokeless tobacco: Never  Vaping Use   Vaping Use: Never used  Substance and Sexual Activity   Alcohol use: Yes    Comment: once every couple of months/social   Drug use: No   Sexual activity: Not on file

## 2021-10-26 DIAGNOSIS — S42201D Unspecified fracture of upper end of right humerus, subsequent encounter for fracture with routine healing: Secondary | ICD-10-CM

## 2021-10-27 DIAGNOSIS — Z713 Dietary counseling and surveillance: Secondary | ICD-10-CM | POA: Diagnosis not present

## 2021-11-06 ENCOUNTER — Other Ambulatory Visit (HOSPITAL_BASED_OUTPATIENT_CLINIC_OR_DEPARTMENT_OTHER): Payer: Self-pay

## 2021-11-06 MED ORDER — LEVOTHYROXINE SODIUM 88 MCG PO TABS
ORAL_TABLET | ORAL | 3 refills | Status: DC
  Filled 2021-11-06: qty 50, 50d supply, fill #0
  Filled 2021-11-18 – 2021-12-18 (×2): qty 50, 50d supply, fill #1

## 2021-11-07 ENCOUNTER — Ambulatory Visit (INDEPENDENT_AMBULATORY_CARE_PROVIDER_SITE_OTHER): Payer: Federal, State, Local not specified - PPO | Admitting: Orthopedic Surgery

## 2021-11-07 ENCOUNTER — Ambulatory Visit (INDEPENDENT_AMBULATORY_CARE_PROVIDER_SITE_OTHER): Payer: Federal, State, Local not specified - PPO

## 2021-11-07 ENCOUNTER — Encounter: Payer: Self-pay | Admitting: Orthopedic Surgery

## 2021-11-07 DIAGNOSIS — S42291A Other displaced fracture of upper end of right humerus, initial encounter for closed fracture: Secondary | ICD-10-CM

## 2021-11-07 NOTE — Progress Notes (Signed)
   Post-Op Visit Note   Patient: Alyssa Estrada           Date of Birth: 1960-12-08           MRN: [id number] Visit Date: 11/07/2021 PCP: Carol Ada, MD   Assessment & Plan:  Chief Complaint:  Chief Complaint  Patient presents with   Right Shoulder - Routine Post Op   Visit Diagnoses:  1. Other closed displaced fracture of proximal end of right humerus, initial encounter     Plan: Alyssa Estrada is a 61 year old patient with right proximal humerus fracture open reduction internal fixation performed 10/09/2021.  Not taking too much in terms of pain medication.  Stopped using a sling 10/22/2021.  On examination she has excellent range of motion of 50/85/100 on the shoulder.  No popping or grinding.  Deltoid is functional.  Incision intact.  Radiographs look good.  No lucencies around the hardware.  Plan at this time is to start physical therapy the week of August 1.  Okay for active assistive range of motion and passive range of motion with mild rotator cuff strengthening 2 times a week for 4 weeks.  We will see her back in 4 weeks for clinical recheck and radiographs.  Anticipate seeing slightly more callus formation around the fracture at that time.  Follow-Up Instructions: No follow-ups on file.   Orders:  Orders Placed This Encounter  Procedures   XR Shoulder Right   Ambulatory referral to Physical Therapy   No orders of the defined types were placed in this encounter.   Imaging: XR Shoulder Right  Result Date: 11/07/2021 AP axillary lateral outlet radiographs right shoulder reviewed.  Hardware unchanged in position and alignment for proximal humerus fracture.  No lucencies around the pegs or screws.  No additional fracture or dislocation.   PMFS History: Patient Active Problem List   Diagnosis Date Noted   Closed fracture of proximal end of right humerus with routine healing    Past Medical History:  Diagnosis Date   Allergy    Hyperlipidemia    Osteoporosis    osteopenia    Thyroid disease     Family History  Problem Relation Age of Onset   Pancreatic cancer Father    Pancreatic cancer Paternal Grandmother    Colon cancer Neg Hx    Colon polyps Neg Hx    Esophageal cancer Neg Hx    Stomach cancer Neg Hx    Rectal cancer Neg Hx     Past Surgical History:  Procedure Laterality Date   ORIF HUMERUS FRACTURE Right 10/09/2021   Procedure: RIGHT SHOULDER OPEN REDUCTION INTERNAL FIXATION (ORIF) PROXIMAL HUMERUS FRACTURE;  Surgeon: Meredith Pel, MD;  Location: [facility];  Service: Orthopedics;  Laterality: Right;   TONSILLECTOMY     Social History   Occupational History   Not on file  Tobacco Use   Smoking status: Never   Smokeless tobacco: Never  Vaping Use   Vaping Use: Never used  Substance and Sexual Activity   Alcohol use: Yes    Comment: once every couple of months/social   Drug use: No   Sexual activity: Not on file

## 2021-11-17 ENCOUNTER — Encounter: Payer: Self-pay | Admitting: Physical Therapy

## 2021-11-17 ENCOUNTER — Ambulatory Visit (INDEPENDENT_AMBULATORY_CARE_PROVIDER_SITE_OTHER): Payer: Federal, State, Local not specified - PPO | Admitting: Physical Therapy

## 2021-11-17 DIAGNOSIS — M25511 Pain in right shoulder: Secondary | ICD-10-CM

## 2021-11-17 DIAGNOSIS — M6281 Muscle weakness (generalized): Secondary | ICD-10-CM

## 2021-11-17 DIAGNOSIS — M25611 Stiffness of right shoulder, not elsewhere classified: Secondary | ICD-10-CM

## 2021-11-17 NOTE — Therapy (Signed)
OUTPATIENT PHYSICAL THERAPY SHOULDER EVALUATION   Patient Name: Alyssa Estrada MRN: [id number] DOB:1960/11/23, 61 y.o., female Today's Date: 11/17/2021   PT End of Session - 11/17/21 1552     Visit Number 1    Number of Visits 8    Date for PT Re-Evaluation 12/19/21    PT Start Time 4098    PT Stop Time 1430    PT Time Calculation (min) 45 min    Activity Tolerance Patient tolerated treatment well    Behavior During Therapy [facility] for tasks assessed/performed             Past Medical History:  Diagnosis Date   Allergy    Hyperlipidemia    Osteoporosis    osteopenia   Thyroid disease    Past Surgical History:  Procedure Laterality Date   ORIF HUMERUS FRACTURE Right 10/09/2021   Procedure: RIGHT SHOULDER OPEN REDUCTION INTERNAL FIXATION (ORIF) PROXIMAL HUMERUS FRACTURE;  Surgeon: Meredith Pel, MD;  Location: [facility];  Service: Orthopedics;  Laterality: Right;   TONSILLECTOMY     Patient Active Problem List   Diagnosis Date Noted   Closed fracture of proximal end of right humerus with routine healing     PCP: Carol Ada MD   REFERRING PROVIDER: Meredith Pel, MD  REFERRING DIAG: [phone] (ICD-10-CM) - Other closed displaced fracture of proximal end of right humerus, initial encounter  THERAPY DIAG:  Acute pain of right shoulder  Stiffness of right shoulder, not elsewhere classified  Muscle weakness (generalized)  Rationale for Evaluation and Treatment Rehabilitation  ONSET DATE: right proximal humerus fracture open reduction internal fixation performed 10/09/2021.   SUBJECTIVE:                                                                                                                                                                                      SUBJECTIVE STATEMENT: Pt reporting fall from ladder where she broke her Rt humerus. Pt reporting doing her CPM at home for 4 hours each day prior to her trip to [facility] last week.   PERTINENT  HISTORY: allergy, hyperlipidemia, osteoporosis, thyroid disease  PAIN:  Are you having pain? Yes: NPRS scale: 2/10 Pain location: Rt shoulder Pain description: achy Aggravating factors: lying on Rt shoulder Relieving factors: changing positions  PRECAUTIONS: no lifting more than 5 pounds  WEIGHT BEARING RESTRICTIONS No  FALLS:  Has patient fallen in last 6 months? Yes. Number of falls 1  LIVING ENVIRONMENT: Lives with: lives with their family and lives with their spouse Lives in: House/apartment Stairs: Yes: Internal: flight steps; on right going up Has following equipment at home: None  OCCUPATION: Part  time, CNA   PLOF: Independent  PATIENT GOALS Be able to use my arm without pain  OBJECTIVE:   DIAGNOSTIC FINDINGS:  Performed on 11/07/21 : AP axillary lateral outlet radiographs right shoulder reviewed.  Hardware  unchanged in position and alignment for proximal humerus fracture.  No  lucencies around the pegs or screws.  No additional fracture or  dislocation.  PATIENT SURVEYS:   11/17/21: FOTO 61% (predicted 72% )  COGNITION: 11/17/21: Overall cognitive status: Within functional limits for tasks assessed     SENSATION: WFL  POSTURE: 11/17/21: Rounded shoulders, forward head  UPPER EXTREMITY ROM:    ROM Right 11/17/21 Active Left 11/17/21 Active  Shoulder flexion 140 164  Shoulder extension 30 40  Shoulder abduction 128 170  Shoulder adduction    Shoulder internal rotation 70 c shoulder abd 45 deg 76 c shoulder abd 45 deg  Shoulder external rotation 70 c shoulder abd 45 deg 82 c shoulder abd 45 deg  Elbow flexion 145 148  Elbow extension 0 0  Wrist flexion    Wrist extension    Wrist ulnar deviation    Wrist radial deviation    Wrist pronation    Wrist supination    (Blank rows = not tested)  UPPER EXTREMITY MMT:  MMT Right eval Left eval  Shoulder flexion 3/5 5/5  Shoulder extension 3/5 5/5  Shoulder abduction 3/5 5/5  Shoulder adduction     Shoulder internal rotation 3/5 5/5  Shoulder external rotation 3/5 5/5  Middle trapezius    Lower trapezius    Elbow flexion    Elbow extension    Wrist flexion    Wrist extension    Wrist ulnar deviation    Wrist radial deviation    Wrist pronation    Wrist supination    Grip strength (lbs)    (Blank rows = not tested)     PALPATION:  11/17/21 TTP along biceps tendon    TODAY'S TREATMENT:  11/17/21:  HEP instruction/performance c cues for techniques, handout provided.  Trial set performed of each for comprehension and symptom assessment.  See below for exercise list.   PATIENT EDUCATION: Education details: PT POC, HEP Person educated: Patient Education method: Explanation, Demonstration, Tactile cues, Verbal cues, and Handouts Education comprehension: verbalized understanding and returned demonstration   HOME EXERCISE PROGRAM: Access Code: [email or web address] URL: [email or web address] Date: 11/17/2021 Prepared by: Kearney Hard  Exercises - Supine Shoulder Flexion Extension AAROM with Dowel  - 2-3 x daily - 7 x weekly - 2 sets - 10 reps - 3-5 seconds hold - Supine Shoulder External Rotation in 45 Degrees Abduction AAROM with Dowel  - 2-3 x daily - 7 x weekly - 2 sets - 10 reps - 3-5 seconds hold - Isometric Shoulder Flexion at Wall  - 2 x daily - 7 x weekly - 10 reps - 5 seconds hold - Isometric Shoulder Extension at Wall  - 2 x daily - 7 x weekly - 10 reps - 5 seconds hold - Standing Isometric Shoulder External Rotation with Doorway  - 2 x daily - 7 x weekly - 10 reps - 5 seconds hold - Standing Isometric Shoulder Internal Rotation with Towel Roll at Doorway  - 2 x daily - 7 x weekly - 10 reps - 5 seconds hold - Standing Row with Anchored Resistance  - 2 x daily - 7 x weekly - 2 sets - 10 reps - 3 seconds hold  ASSESSMENT:  CLINICAL IMPRESSION: Patient is a 61 y.o.  who comes to clinic with complaints of Rt arm  pain s/p  proximal humerus fracture open  reduction internal fixation performed 10/09/2021 c mobility, strength and movement coordination deficits that impair their ability to perform usual daily and recreational functional activities without increase difficulty.  Patient to benefit from skilled PT services to address impairments and limitations to improve to previous level of function without restriction secondary to condition.    OBJECTIVE IMPAIRMENTS decreased ROM, decreased strength, increased edema, impaired UE functional use, and pain.   ACTIVITY LIMITATIONS carrying, lifting, sleeping, dressing, and reach over head  PARTICIPATION LIMITATIONS: cleaning, laundry, shopping, and community activity  PERSONAL FACTORS allergy, hyperlipidemia, osteoporosis, thyroid disease are also affecting patient's functional outcome.   REHAB POTENTIAL: Good  CLINICAL DECISION MAKING: Stable/uncomplicated  EVALUATION COMPLEXITY: Low   GOALS: Goals reviewed with patient? Yes  SHORT TERM GOALS: Target date: 12/05/21  (Remove Blue Hyperlink) Patient will demonstrate independent use of initial home exercise program to maintain progress from in clinic treatments. Goal status: New   LONG TERM GOALS: Target date: 01/03/2022  (Remove Blue Hyperlink) Patient will demonstrate/report pain at worst less than or equal to 2/10 to facilitate minimal limitation in daily activity secondary to pain symptoms. Goal status: New   Patient will demonstrate independent use of home exercise program to facilitate ability to maintain/progress functional gains from skilled physical therapy services. Goal status: New   Patient will demonstrate FOTO outcome > or = 78 % to indicate reduced disability due to condition. Goal status: New   Pt will improve her Rt shoulder strength to grossly 4/5 in order to improve functional mobility.  Goal status: New       5.  Pt will be able to lift 5 # from counter to over head shelf c no pain.   Goal status: New    PLAN: PT  FREQUENCY: 2x/week  PT DURATION: 6 weeks  PLANNED INTERVENTIONS: Therapeutic exercises, Therapeutic activity, Neuromuscular re-education, Balance training, Gait training, Patient/Family education, Self Care, Joint mobilization, Dry Needling, Electrical stimulation, Cryotherapy, Vasopneumatic device, Ultrasound, and Manual therapy  PLAN FOR NEXT SESSION: review HEP, shoulder/elbow ROM, AAROM, PROM and mild strengthening of RC per MD note   Oretha Caprice, PT, MPT 11/17/2021, 3:53 PM

## 2021-11-18 ENCOUNTER — Other Ambulatory Visit (HOSPITAL_BASED_OUTPATIENT_CLINIC_OR_DEPARTMENT_OTHER): Payer: Self-pay

## 2021-11-21 ENCOUNTER — Other Ambulatory Visit (HOSPITAL_BASED_OUTPATIENT_CLINIC_OR_DEPARTMENT_OTHER): Payer: Self-pay

## 2021-11-24 ENCOUNTER — Ambulatory Visit (INDEPENDENT_AMBULATORY_CARE_PROVIDER_SITE_OTHER): Payer: Federal, State, Local not specified - PPO | Admitting: Physical Therapy

## 2021-11-24 ENCOUNTER — Encounter: Payer: Self-pay | Admitting: Physical Therapy

## 2021-11-24 DIAGNOSIS — M25611 Stiffness of right shoulder, not elsewhere classified: Secondary | ICD-10-CM

## 2021-11-24 DIAGNOSIS — M6281 Muscle weakness (generalized): Secondary | ICD-10-CM

## 2021-11-24 DIAGNOSIS — M25511 Pain in right shoulder: Secondary | ICD-10-CM | POA: Diagnosis not present

## 2021-11-24 NOTE — Therapy (Signed)
OUTPATIENT PHYSICAL THERAPY TREATMENT NOTE   Patient Name: Alyssa Estrada MRN: [id number] DOB:[birth year], 61 y.o., female Today's Date: 11/24/2021  PCP: Carol Ada MD  REFERRING PROVIDER: Meredith Pel, MD  END OF SESSION:   PT End of Session - 11/24/21 1522     Visit Number 2    Number of Visits 8    Date for PT Re-Evaluation 12/19/21    PT Start Time 3536    PT Stop Time 1515    PT Time Calculation (min) 41 min    Activity Tolerance Patient tolerated treatment well    Behavior During Therapy [facility] for tasks assessed/performed             Past Medical History:  Diagnosis Date   Allergy    Hyperlipidemia    Osteoporosis    osteopenia   Thyroid disease    Past Surgical History:  Procedure Laterality Date   ORIF HUMERUS FRACTURE Right 10/09/2021   Procedure: RIGHT SHOULDER OPEN REDUCTION INTERNAL FIXATION (ORIF) PROXIMAL HUMERUS FRACTURE;  Surgeon: Meredith Pel, MD;  Location: [place];  Service: Orthopedics;  Laterality: Right;   TONSILLECTOMY     Patient Active Problem List   Diagnosis Date Noted   Closed fracture of proximal end of right humerus with routine healing     REFERRING DIAG:  S42.291A (ICD-10-CM) - Other closed displaced fracture of proximal end of right humerus, initial encounter  THERAPY DIAG:  Acute pain of right shoulder  Stiffness of right shoulder, not elsewhere classified  Muscle weakness (generalized)  Rationale for Evaluation and Treatment Rehabilitation  PERTINENT HISTORY: allergy, hyperlipidemia, osteoporosis, thyroid disease  PRECAUTIONS: no lifting more than 5 pounds  SUBJECTIVE: Pt with no pain upon arrival Pt stating there is more stiffness in the am. Pt stating her pain is 2/10 in the morning.   PAIN:  Are you having pain? Yes: NPRS scale: 2/10 Pain location: Rt shoulder Pain description: achy Aggravating factors: certain movements Relieving factors: changing positions   OBJECTIVE: (objective measures  completed at initial evaluation unless otherwise dated)  DIAGNOSTIC FINDINGS:  Performed on 11/07/21 : AP axillary lateral outlet radiographs right shoulder reviewed.  Hardware  unchanged in position and alignment for proximal humerus fracture.  No  lucencies around the pegs or screws.  No additional fracture or  dislocation.   PATIENT SURVEYS:   11/17/21: FOTO 61% (predicted 72% )   COGNITION: 11/17/21: Overall cognitive status: Within functional limits for tasks assessed                                  SENSATION: WFL   POSTURE: 11/17/21: Rounded shoulders, forward head   UPPER EXTREMITY ROM:     ROM Right 11/17/21 Active Left 11/17/21 Active Rt 11/24/21 Active  Shoulder flexion 140 164 155  Shoulder extension 30 40   Shoulder abduction 128 170 138  Shoulder adduction       Shoulder internal rotation 70 c shoulder abd 45 deg 76 c shoulder abd 45 deg 70 c shoulder abd 45 deg  Shoulder external rotation 70 c shoulder abd 45 deg 82 c shoulder abd 45 deg 76 c shoulder abd 45 deg  Elbow flexion 145 148   Elbow extension 0 0   Wrist flexion       Wrist extension       Wrist ulnar deviation       Wrist radial deviation  Wrist pronation       Wrist supination       (Blank rows = not tested)   UPPER EXTREMITY MMT:   MMT Right eval Left eval  Shoulder flexion 3/5 5/5  Shoulder extension 3/5 5/5  Shoulder abduction 3/5 5/5  Shoulder adduction      Shoulder internal rotation 3/5 5/5  Shoulder external rotation 3/5 5/5  Middle trapezius      Lower trapezius      Elbow flexion      Elbow extension      Wrist flexion      Wrist extension      Wrist ulnar deviation      Wrist radial deviation      Wrist pronation      Wrist supination      Grip strength (lbs)      (Blank rows = not tested)         PALPATION:  11/17/21 TTP along biceps tendon              TODAY'S TREATMENT:  11/24/2021:  TherEx:  Pulleys: flexion x 3 minutes, scaption x 3 minutes UE Ranger:  flexion x 1 minute, scaption x 1 minute, circles both directions x 1 minute  Wall ladder: flexion x 5 (# 27) Wall laddder scaption x 5  (# 26) Rows: Level 4 band x 20 Walking  yellow physioball up the wall x 10 holding 5 seconds each Isometics: flexion/extension/IR/ER x 5 each holding 5 seconds Supine shoulder flexion AAROM 2 x 10 holding 5 sec Supine Shoulder EX AAROM 2 x 10 holding 5 seconds  Manual: PROM Rt shoulder all planes    11/17/21:  HEP instruction/performance c cues for techniques, handout provided.  Trial set performed of each for comprehension and symptom assessment.  See below for exercise list.     PATIENT EDUCATION: Education details: PT POC, HEP Person educated: Patient Education method: Explanation, Demonstration, Tactile cues, Verbal cues, and Handouts Education comprehension: verbalized understanding and returned demonstration     HOME EXERCISE PROGRAM: Access Code: [email or web address] URL: [email or web address] Date: 11/17/2021 Prepared by: Kearney Hard   Exercises - Supine Shoulder Flexion Extension AAROM with Dowel  - 2-3 x daily - 7 x weekly - 2 sets - 10 reps - 3-5 seconds hold - Supine Shoulder External Rotation in 45 Degrees Abduction AAROM with Dowel  - 2-3 x daily - 7 x weekly - 2 sets - 10 reps - 3-5 seconds hold - Isometric Shoulder Flexion at Wall  - 2 x daily - 7 x weekly - 10 reps - 5 seconds hold - Isometric Shoulder Extension at Wall  - 2 x daily - 7 x weekly - 10 reps - 5 seconds hold - Standing Isometric Shoulder External Rotation with Doorway  - 2 x daily - 7 x weekly - 10 reps - 5 seconds hold - Standing Isometric Shoulder Internal Rotation with Towel Roll at Doorway  - 2 x daily - 7 x weekly - 10 reps - 5 seconds hold - Standing Row with Anchored Resistance  - 2 x daily - 7 x weekly - 2 sets - 10 reps - 3 seconds hold   ASSESSMENT:   CLINICAL IMPRESSION: Pt tolerating exercises well. Pt with no pain reported at rest. Pt  reporting compliance in her HEP. Continue skilled PT to maximize pt's function.      OBJECTIVE IMPAIRMENTS decreased ROM, decreased strength, increased edema, impaired UE functional use, and pain.    ACTIVITY LIMITATIONS  carrying, lifting, sleeping, dressing, and reach over head   PARTICIPATION LIMITATIONS: cleaning, laundry, shopping, and community activity   PERSONAL FACTORS allergy, hyperlipidemia, osteoporosis, thyroid disease are also affecting patient's functional outcome.    REHAB POTENTIAL: Good   CLINICAL DECISION MAKING: Stable/uncomplicated   EVALUATION COMPLEXITY: Low     GOALS: Goals reviewed with patient? Yes   SHORT TERM GOALS: Target date: 12/05/21  (Remove Blue Hyperlink) Patient will demonstrate independent use of initial home exercise program to maintain progress from in clinic treatments. Goal status: on-going 11/24/21     LONG TERM GOALS: Target date: 01/03/2022  (Remove Blue Hyperlink) Patient will demonstrate/report pain at worst less than or equal to 2/10 to facilitate minimal limitation in daily activity secondary to pain symptoms. Goal status: New   Patient will demonstrate independent use of home exercise program to facilitate ability to maintain/progress functional gains from skilled physical therapy services. Goal status: New   Patient will demonstrate FOTO outcome > or = 78 % to indicate reduced disability due to condition. Goal status: New   Pt will improve her Rt shoulder strength to grossly 4/5 in order to improve functional mobility.  Goal status: New       5.  Pt will be able to lift 5 # from counter to over head shelf c no pain.   Goal status: New       PLAN: PT FREQUENCY: 2x/week   PT DURATION: 6 weeks   PLANNED INTERVENTIONS: Therapeutic exercises, Therapeutic activity, Neuromuscular re-education, Balance training, Gait training, Patient/Family education, Self Care, Joint mobilization, Dry Needling, Electrical stimulation,  Cryotherapy, Vasopneumatic device, Ultrasound, and Manual therapy   PLAN FOR NEXT SESSION: shoulder/elbow ROM, AAROM, PROM and mild strengthening of RC per MD note      Oretha Caprice, PT, MPT 11/24/2021, 3:23 PM

## 2021-11-27 ENCOUNTER — Encounter: Payer: Self-pay | Admitting: Physical Therapy

## 2021-11-27 ENCOUNTER — Ambulatory Visit (INDEPENDENT_AMBULATORY_CARE_PROVIDER_SITE_OTHER): Payer: Federal, State, Local not specified - PPO | Admitting: Physical Therapy

## 2021-11-27 DIAGNOSIS — M25511 Pain in right shoulder: Secondary | ICD-10-CM

## 2021-11-27 DIAGNOSIS — M25611 Stiffness of right shoulder, not elsewhere classified: Secondary | ICD-10-CM | POA: Diagnosis not present

## 2021-11-27 DIAGNOSIS — M6281 Muscle weakness (generalized): Secondary | ICD-10-CM

## 2021-11-27 NOTE — Patient Instructions (Signed)
TableTop Towel Wipes  Standing in front of a table or countertop with a towel under your Right hand and your Left hand planted on the table surface directly under your shoulder 15 times reaching straight forward and back 15 times reaching across side to side in front of Left hand 5 times each direction spiraling circles staring small and going larger then smaller again, alternate clockwise and counterclockwise

## 2021-11-27 NOTE — Therapy (Signed)
OUTPATIENT PHYSICAL THERAPY TREATMENT NOTE   Patient Name: Alyssa Estrada MRN: [id number] DOB:Jul 09, 1960, 61 y.o., female Today's Date: 11/27/2021  PCP: Carol Ada MD  REFERRING PROVIDER: Meredith Pel, MD  END OF SESSION:   PT End of Session - 11/27/21 1020     Visit Number 3    Number of Visits 8    Date for PT Re-Evaluation 12/19/21    PT Start Time 2202    PT Stop Time 1103    PT Time Calculation (min) 48 min    Activity Tolerance Patient tolerated treatment well    Behavior During Therapy [facility] for tasks assessed/performed             Past Medical History:  Diagnosis Date   Allergy    Hyperlipidemia    Osteoporosis    osteopenia   Thyroid disease    Past Surgical History:  Procedure Laterality Date   ORIF HUMERUS FRACTURE Right 10/09/2021   Procedure: RIGHT SHOULDER OPEN REDUCTION INTERNAL FIXATION (ORIF) PROXIMAL HUMERUS FRACTURE;  Surgeon: Meredith Pel, MD;  Location: [place];  Service: Orthopedics;  Laterality: Right;   TONSILLECTOMY     Patient Active Problem List   Diagnosis Date Noted   Closed fracture of proximal end of right humerus with routine healing     REFERRING DIAG:  S42.291A (ICD-10-CM) - Other closed displaced fracture of proximal end of right humerus, initial encounter  THERAPY DIAG:  Acute pain of right shoulder  Stiffness of right shoulder, not elsewhere classified  Muscle weakness (generalized)  Rationale for Evaluation and Treatment Rehabilitation  PERTINENT HISTORY: allergy, hyperlipidemia, osteoporosis, thyroid disease  PRECAUTIONS: no lifting more than 5 pounds  SUBJECTIVE: She has no shoulder pain since last appointment. She added in some of the exercises done in clinic without seeing an update on her HEP, and reported some lateral elbow pain with a banded ER isometric exercise.  PAIN:  Are you having pain? Yes: NPRS scale: 0/10 Pain location: Rt shoulder Pain description: achy Aggravating factors: certain  movements Relieving factors: changing positions   OBJECTIVE: (objective measures completed at initial evaluation unless otherwise dated)  DIAGNOSTIC FINDINGS:  Performed on 11/07/21 : AP axillary lateral outlet radiographs right shoulder reviewed.  Hardware  unchanged in position and alignment for proximal humerus fracture.  No  lucencies around the pegs or screws.  No additional fracture or  dislocation.   PATIENT SURVEYS:   11/17/21: FOTO 61% (predicted 72% )   COGNITION: 11/17/21: Overall cognitive status: Within functional limits for tasks assessed                                  SENSATION: WFL   POSTURE: 11/17/21: Rounded shoulders, forward head   UPPER EXTREMITY ROM:     ROM Right 11/17/21 Active Left 11/17/21 Active Rt 11/24/21 Active  Shoulder flexion 140 164 155  Shoulder extension 30 40   Shoulder abduction 128 170 138  Shoulder adduction       Shoulder internal rotation 70 c shoulder abd 45 deg 76 c shoulder abd 45 deg 70 c shoulder abd 45 deg  Shoulder external rotation 70 c shoulder abd 45 deg 82 c shoulder abd 45 deg 76 c shoulder abd 45 deg  Elbow flexion 145 148   Elbow extension 0 0   Wrist flexion       Wrist extension       Wrist ulnar deviation  Wrist radial deviation       Wrist pronation       Wrist supination       (Blank rows = not tested)   UPPER EXTREMITY MMT:   MMT Right eval Left eval  Shoulder flexion 3/5 5/5  Shoulder extension 3/5 5/5  Shoulder abduction 3/5 5/5  Shoulder adduction      Shoulder internal rotation 3/5 5/5  Shoulder external rotation 3/5 5/5  Middle trapezius      Lower trapezius      Elbow flexion      Elbow extension      Wrist flexion      Wrist extension      Wrist ulnar deviation      Wrist radial deviation      Wrist pronation      Wrist supination      Grip strength (lbs)      (Blank rows = not tested)   FUNCTIONAL TESTING 11/27/21 Assessed AROM/PROM of wrist pronation, supination, flexion,  extension for lateral elbow pain - all motions WNL and pain-free   PALPATION:  11/17/21 TTP along biceps tendon              TODAY'S TREATMENT:  11/27/2021: TherEx:  Pulleys: flexion x 3 minutes, scaption x 3 minutes with verbal cueing to look at hand during upward motion Standing UE Ranger: flexion x 1 minute, scaption x 1 minute with verbal cueing for anterior step and looking at hand Standing tabletop towel reaching x 15 flexion, x 15 M-L with adduction past midline, x 5 alternating counterclockwise/clockwise spirals small to large to small. Added to HEP via pt instructions with pt given handout. Pt verbalized understanding.  Standing ER isometrics x 10 with 5s hold with some lateral elbow pain Rows: green band x 5 with cueing for standing form and home set up, pt given new band with knot Seated ER with dowel x 10 with 3s hold Seated AROM shoulder extension x 5 with cueing for upright posture Seated AROM shoulder extension and IR reaching along waistband x 20, pt reports stretch loosening and tolerable motion  11/24/2021:  TherEx:  Pulleys: flexion x 3 minutes, scaption x 3 minutes UE Ranger: flexion x 1 minute, scaption x 1 minute, circles both directions x 1 minute  Wall ladder: flexion x 5 (# 27) Wall laddder scaption x 5  (# 26) Rows: Level 4 band x 20 Walking  yellow physioball up the wall x 10 holding 5 seconds each Isometics: flexion/extension/IR/ER x 5 each holding 5 seconds Supine shoulder flexion AAROM 2 x 10 holding 5 sec Supine Shoulder EX AAROM 2 x 10 holding 5 seconds  Manual: PROM Rt shoulder all planes  11/17/21:  HEP instruction/performance c cues for techniques, handout provided.  Trial set performed of each for comprehension and symptom assessment.  See below for exercise list.     PATIENT EDUCATION: Education details: PT POC, HEP Person educated: Patient Education method: Explanation, Demonstration, Tactile cues, Verbal cues, and Handouts Education  comprehension: verbalized understanding and returned demonstration     HOME EXERCISE PROGRAM: Access Code: [email or web address] URL: [email or web address] Date: 11/27/2021 Prepared by: Jamey Reas  Exercises - Supine Shoulder Flexion Extension AAROM with Dowel  - 2-3 x daily - 7 x weekly - 2 sets - 10 reps - 3-5 seconds hold - Supine Shoulder External Rotation in 45 Degrees Abduction AAROM with Dowel  - 2-3 x daily - 7 x weekly - 2 sets - 10 reps - 3-5 seconds  hold - Seated Shoulder External Rotation AAROM with Dowel  - 2-3 x daily - 7 x weekly - 2 sets - 10 reps - 3-5 seconds hold - Shoulder Internal Rotation and Extension  - 2-3 x daily - 7 x weekly - 2 sets - 10 reps - 5 seconds hold - Isometric Shoulder Flexion at Wall  - 2 x daily - 7 x weekly - 10 reps - 5 seconds hold - Isometric Shoulder Extension at Wall  - 2 x daily - 7 x weekly - 10 reps - 5 seconds hold - Standing Isometric Shoulder External Rotation with Doorway  - 2 x daily - 7 x weekly - 10 reps - 5 seconds hold - Standing Isometric Shoulder Internal Rotation with Towel Roll at Doorway  - 2 x daily - 7 x weekly - 10 reps - 5 seconds hold - Standing Row with Anchored Resistance  - 2 x daily - 7 x weekly - 2 sets - 10 reps - 3 seconds hold  11/27/2021 added TableTop Towel Wipes Standing in front of a table or countertop with a towel under your Right hand and your Left hand planted on the table surface directly under your shoulder 15 times reaching straight forward and back 15 times reaching across side to side in front of Left hand 5 times each direction spiraling circles staring small and going larger then smaller again, alternate clockwise and counterclockwise   ASSESSMENT:   CLINICAL IMPRESSION: She reported some lateral elbow pain following increasing her exercises since last session with a banded ER isometric exercise, and wrist AROM/PROM did not elicit sx so it is likely irritation from increased exercise. HEP was  updated with a seated modification to ER and IR stretching and a standing AAROM multidirectional exercise. She continues to benefit from skilled PT to address impairments.     OBJECTIVE IMPAIRMENTS decreased ROM, decreased strength, increased edema, impaired UE functional use, and pain.    ACTIVITY LIMITATIONS carrying, lifting, sleeping, dressing, and reach over head   PARTICIPATION LIMITATIONS: cleaning, laundry, shopping, and community activity   PERSONAL FACTORS allergy, hyperlipidemia, osteoporosis, thyroid disease are also affecting patient's functional outcome.    REHAB POTENTIAL: Good   CLINICAL DECISION MAKING: Stable/uncomplicated   EVALUATION COMPLEXITY: Low     GOALS: Goals reviewed with patient? Yes   SHORT TERM GOALS: Target date: 12/05/21  (Remove Blue Hyperlink) Patient will demonstrate independent use of initial home exercise program to maintain progress from in clinic treatments. Goal status: on-going 11/24/21     LONG TERM GOALS: Target date: 01/03/2022  (Remove Blue Hyperlink) Patient will demonstrate/report pain at worst less than or equal to 2/10 to facilitate minimal limitation in daily activity secondary to pain symptoms. Goal status: New   Patient will demonstrate independent use of home exercise program to facilitate ability to maintain/progress functional gains from skilled physical therapy services. Goal status: New   Patient will demonstrate FOTO outcome > or = 78 % to indicate reduced disability due to condition. Goal status: New   Pt will improve her Rt shoulder strength to grossly 4/5 in order to improve functional mobility.  Goal status: New       5.  Pt will be able to lift 5 # from counter to over head shelf c no pain.   Goal status: New       PLAN: PT FREQUENCY: 2x/week   PT DURATION: 6 weeks   PLANNED INTERVENTIONS: Therapeutic exercises, Therapeutic activity, Neuromuscular re-education, Balance training, Gait training,  Patient/Family  education, Self Care, Joint mobilization, Dry Needling, Electrical stimulation, Cryotherapy, Vasopneumatic device, Ultrasound, and Manual therapy   PLAN FOR NEXT SESSION: continue shoulder/elbow ROM, AAROM, PROM and mild strengthening of RC per MD note, objective measure shoulder AROM    Jana Hakim, Student-PT 11/27/2021, 11:31 AM  This entire session of physical therapy was performed under the direct supervision of PT signing evaluation /treatment. PT reviewed note and agrees.  Jamey Reas, PT, DPT 11/27/2021, 12:25 PM

## 2021-12-01 ENCOUNTER — Ambulatory Visit (INDEPENDENT_AMBULATORY_CARE_PROVIDER_SITE_OTHER): Payer: Federal, State, Local not specified - PPO | Admitting: Physical Therapy

## 2021-12-01 ENCOUNTER — Encounter: Payer: Self-pay | Admitting: Physical Therapy

## 2021-12-01 DIAGNOSIS — M6281 Muscle weakness (generalized): Secondary | ICD-10-CM

## 2021-12-01 DIAGNOSIS — M25511 Pain in right shoulder: Secondary | ICD-10-CM | POA: Diagnosis not present

## 2021-12-01 DIAGNOSIS — M25611 Stiffness of right shoulder, not elsewhere classified: Secondary | ICD-10-CM

## 2021-12-01 NOTE — Therapy (Signed)
OUTPATIENT PHYSICAL THERAPY TREATMENT NOTE   Patient Name: Alyssa Estrada MRN: [id number] DOB:Jul 11, 1960, 61 y.o., female Today's Date: 12/01/2021  PCP: Carol Ada MD  REFERRING PROVIDER: Meredith Pel, MD  END OF SESSION:   PT End of Session - 12/01/21 1014     Visit Number 4    Number of Visits 8    Date for PT Re-Evaluation 12/19/21    PT Start Time 1016    PT Stop Time 1100    PT Time Calculation (min) 44 min    Activity Tolerance Patient tolerated treatment well    Behavior During Therapy [facility] for tasks assessed/performed             Past Medical History:  Diagnosis Date   Allergy    Hyperlipidemia    Osteoporosis    osteopenia   Thyroid disease    Past Surgical History:  Procedure Laterality Date   ORIF HUMERUS FRACTURE Right 10/09/2021   Procedure: RIGHT SHOULDER OPEN REDUCTION INTERNAL FIXATION (ORIF) PROXIMAL HUMERUS FRACTURE;  Surgeon: Meredith Pel, MD;  Location: [place];  Service: Orthopedics;  Laterality: Right;   TONSILLECTOMY     Patient Active Problem List   Diagnosis Date Noted   Closed fracture of proximal end of right humerus with routine healing     REFERRING DIAG:  S42.291A (ICD-10-CM) - Other closed displaced fracture of proximal end of right humerus, initial encounter  THERAPY DIAG:  Acute pain of right shoulder  Stiffness of right shoulder, not elsewhere classified  Muscle weakness (generalized)  Rationale for Evaluation and Treatment Rehabilitation  PERTINENT HISTORY: allergy, hyperlipidemia, osteoporosis, thyroid disease  PRECAUTIONS: no lifting more than 5 pounds  SUBJECTIVE: It is getting easier to do light chores at home. She has a catching pain when lifting her hand to the radio height while driving and reaching behind her back. She has stiffness in the morning and sleeps on her back or Lt side. She has added in the exercises from last week with no issues.  PAIN:  Are you having pain? Yes: NPRS scale:  0/10 Pain location: Rt shoulder Pain description: achy Aggravating factors: certain movements Relieving factors: changing positions   OBJECTIVE: (objective measures completed at initial evaluation unless otherwise dated)  DIAGNOSTIC FINDINGS:  Performed on 11/07/21 : AP axillary lateral outlet radiographs right shoulder reviewed.  Hardware  unchanged in position and alignment for proximal humerus fracture.  No  lucencies around the pegs or screws.  No additional fracture or  dislocation.   PATIENT SURVEYS:   11/17/21: FOTO 61% (predicted 72% )   COGNITION: 11/17/21: Overall cognitive status: Within functional limits for tasks assessed                                  SENSATION: WFL   POSTURE: 11/17/21: Rounded shoulders, forward head   UPPER EXTREMITY ROM:     ROM Right 11/17/21 Active Left 11/17/21 Active Rt 11/24/21 Active Right 12/01/21 active  Shoulder flexion 140 164 155 supine 174* Seated 175* Glenohumeral Motion 136*  Shoulder extension 30 40    Shoulder abduction 128 170 138 Supine 180* Seated 176* Glenohumeral Motion 125*  Shoulder adduction        Shoulder internal rotation 70 c shoulder abd 45 deg 76 c shoulder abd 45 deg 70 c shoulder abd 45 deg Supine abd 45* 79*   Shoulder external rotation 70 c shoulder abd 45 deg 82 c  shoulder abd 45 deg 76 c shoulder abd 45 deg Supine abd 45* 78*  Elbow flexion 145 148    Elbow extension 0 0    Wrist flexion        Wrist extension        Wrist ulnar deviation        Wrist radial deviation        Wrist pronation        Wrist supination        (Blank rows = not tested)   UPPER EXTREMITY MMT:   MMT Right eval Left eval  Shoulder flexion 3/5 5/5  Shoulder extension 3/5 5/5  Shoulder abduction 3/5 5/5  Shoulder adduction      Shoulder internal rotation 3/5 5/5  Shoulder external rotation 3/5 5/5  Middle trapezius      Lower trapezius      Elbow flexion      Elbow extension      Wrist flexion       Wrist extension      Wrist ulnar deviation      Wrist radial deviation      Wrist pronation      Wrist supination      Grip strength (lbs)      (Blank rows = not tested)   FUNCTIONAL TESTING 11/27/21 Assessed AROM/PROM of wrist pronation, supination, flexion, extension for lateral elbow pain - all motions WNL and pain-free   PALPATION:  11/17/21 TTP along biceps tendon              TODAY'S TREATMENT:  12/01/2021: TherEx:  Pulleys: flexion x 3 minutes, scaption x 3 minutes with verbal cueing to look at hand during upward motion and hand position Standing UE Ranger: flexion x 2 minutes, scaption x 2 minutes with verbal cueing for anterior step and looking at hand Standing shoulder extension red band 2 x 10 reps Elbow PROM stretch with active supination and pronation. Pt verbalized understanding for home. Seated antigravity ROM as noted in objective section  Self Care: PT explained and demo sleeping set up for shoulder comfort, pt verbalized understanding  11/27/2021: TherEx:  Pulleys: flexion x 3 minutes, scaption x 3 minutes with verbal cueing to look at hand during upward motion Standing UE Ranger: flexion x 1 minute, scaption x 1 minute with verbal cueing for anterior step and looking at hand Standing tabletop towel reaching x 15 flexion, x 15 M-L with adduction past midline, x 5 alternating counterclockwise/clockwise spirals small to large to small. Added to HEP via pt instructions with pt given handout. Pt verbalized understanding.  Standing ER isometrics x 10 with 5s hold with some lateral elbow pain Rows: green band x 5 with cueing for standing form and home set up, pt given new band with knot Seated ER with dowel x 10 with 3s hold Seated AROM shoulder extension x 5 with cueing for upright posture Seated AROM shoulder extension and IR reaching along waistband x 20, pt reports stretch loosening and tolerable motion  11/24/2021:  TherEx:  Pulleys: flexion x 3 minutes, scaption x  3 minutes UE Ranger: flexion x 1 minute, scaption x 1 minute, circles both directions x 1 minute  Wall ladder: flexion x 5 (# 27) Wall laddder scaption x 5  (# 26) Rows: Level 4 band x 20 Walking  yellow physioball up the wall x 10 holding 5 seconds each Isometics: flexion/extension/IR/ER x 5 each holding 5 seconds Supine shoulder flexion AAROM 2 x 10 holding 5 sec  Supine Shoulder EX AAROM 2 x 10 holding 5 seconds  Manual: PROM Rt shoulder all planes    PATIENT EDUCATION: Education details: PT POC, HEP Person educated: Patient Education method: Explanation, Demonstration, Tactile cues, Verbal cues, and Handouts Education comprehension: verbalized understanding and returned demonstration     HOME EXERCISE PROGRAM: Access Code: [email or web address] URL: [email or web address] Date: 11/27/2021 Prepared by: Jamey Reas  Exercises - Supine Shoulder Flexion Extension AAROM with Dowel  - 2-3 x daily - 7 x weekly - 2 sets - 10 reps - 3-5 seconds hold - Supine Shoulder External Rotation in 45 Degrees Abduction AAROM with Dowel  - 2-3 x daily - 7 x weekly - 2 sets - 10 reps - 3-5 seconds hold - Seated Shoulder External Rotation AAROM with Dowel  - 2-3 x daily - 7 x weekly - 2 sets - 10 reps - 3-5 seconds hold - Shoulder Internal Rotation and Extension  - 2-3 x daily - 7 x weekly - 2 sets - 10 reps - 5 seconds hold - Isometric Shoulder Flexion at Wall  - 2 x daily - 7 x weekly - 10 reps - 5 seconds hold - Isometric Shoulder Extension at Wall  - 2 x daily - 7 x weekly - 10 reps - 5 seconds hold - Standing Isometric Shoulder External Rotation with Doorway  - 2 x daily - 7 x weekly - 10 reps - 5 seconds hold - Standing Isometric Shoulder Internal Rotation with Towel Roll at Doorway  - 2 x daily - 7 x weekly - 10 reps - 5 seconds hold - Standing Row with Anchored Resistance  - 2 x daily - 7 x weekly - 2 sets - 10 reps - 3 seconds hold  11/27/2021 added TableTop Towel Wipes Standing in  front of a table or countertop with a towel under your Right hand and your Left hand planted on the table surface directly under your shoulder 15 times reaching straight forward and back 15 times reaching across side to side in front of Left hand 5 times each direction spiraling circles staring small and going larger then smaller again, alternate clockwise and counterclockwise   ASSESSMENT:   CLINICAL IMPRESSION: Her range has improved to normal values for GH and full shoulder flexion and abduction, with some limitation in IR and ER but continues to improve. She is progressing with mild strengthening activity and continues to perform home exercises as suggested for mobility and strengthening. She continues to benefit from skilled PT to address functional deficits.     OBJECTIVE IMPAIRMENTS decreased ROM, decreased strength, increased edema, impaired UE functional use, and pain.    ACTIVITY LIMITATIONS carrying, lifting, sleeping, dressing, and reach over head   PARTICIPATION LIMITATIONS: cleaning, laundry, shopping, and community activity   PERSONAL FACTORS allergy, hyperlipidemia, osteoporosis, thyroid disease are also affecting patient's functional outcome.    REHAB POTENTIAL: Good   CLINICAL DECISION MAKING: Stable/uncomplicated   EVALUATION COMPLEXITY: Low     GOALS: Goals reviewed with patient? Yes   SHORT TERM GOALS: Target date: 12/05/21  (Remove Blue Hyperlink) Patient will demonstrate independent use of initial home exercise program to maintain progress from in clinic treatments. Goal status: met for HEP to date 12/01/21     LONG TERM GOALS: Target date: 01/03/2022  (Remove Blue Hyperlink) Patient will demonstrate/report pain at worst less than or equal to 2/10 to facilitate minimal limitation in daily activity secondary to pain symptoms. Goal status: New   Patient will demonstrate independent use of  home exercise program to facilitate ability to maintain/progress  functional gains from skilled physical therapy services. Goal status: New   Patient will demonstrate FOTO outcome > or = 78 % to indicate reduced disability due to condition. Goal status: New   Pt will improve her Rt shoulder strength to grossly 4/5 in order to improve functional mobility.  Goal status: New       5.  Pt will be able to lift 5 # from counter to over head shelf c no pain.   Goal status: New       PLAN: PT FREQUENCY: 2x/week   PT DURATION: 6 weeks   PLANNED INTERVENTIONS: Therapeutic exercises, Therapeutic activity, Neuromuscular re-education, Balance training, Gait training, Patient/Family education, Self Care, Joint mobilization, Dry Needling, Electrical stimulation, Cryotherapy, Vasopneumatic device, Ultrasound, and Manual therapy   PLAN FOR NEXT SESSION: Send next note to MD, continue progressing ROM & strength with only mild strengthening of RC per MD note    Jana Hakim, Student-PT 12/01/2021, 11:06 AM  This entire session of physical therapy was performed under the direct supervision of PT signing evaluation /treatment. PT reviewed note and agrees.  Jamey Reas, PT, DPT 11/27/2021, 12:25 PM

## 2021-12-02 DIAGNOSIS — Z713 Dietary counseling and surveillance: Secondary | ICD-10-CM | POA: Diagnosis not present

## 2021-12-03 ENCOUNTER — Ambulatory Visit (INDEPENDENT_AMBULATORY_CARE_PROVIDER_SITE_OTHER): Payer: Federal, State, Local not specified - PPO | Admitting: Physical Therapy

## 2021-12-03 ENCOUNTER — Encounter: Payer: Self-pay | Admitting: Physical Therapy

## 2021-12-03 DIAGNOSIS — M25611 Stiffness of right shoulder, not elsewhere classified: Secondary | ICD-10-CM

## 2021-12-03 DIAGNOSIS — M25511 Pain in right shoulder: Secondary | ICD-10-CM

## 2021-12-03 DIAGNOSIS — M6281 Muscle weakness (generalized): Secondary | ICD-10-CM | POA: Diagnosis not present

## 2021-12-03 NOTE — Therapy (Signed)
OUTPATIENT PHYSICAL THERAPY TREATMENT NOTE   Patient Name: Alyssa Estrada MRN: [id number] DOB:December 24, 1960, 61 y.o., female Today's Date: 12/03/2021  PCP: Carol Ada MD  REFERRING PROVIDER: Meredith Pel, MD  END OF SESSION:   PT End of Session - 12/03/21 0931     Visit Number 5    Number of Visits 8    Date for PT Re-Evaluation 12/19/21    PT Start Time 0930    PT Stop Time 1015    PT Time Calculation (min) 45 min    Activity Tolerance Patient tolerated treatment well    Behavior During Therapy [facility] for tasks assessed/performed             Past Medical History:  Diagnosis Date   Allergy    Hyperlipidemia    Osteoporosis    osteopenia   Thyroid disease    Past Surgical History:  Procedure Laterality Date   ORIF HUMERUS FRACTURE Right 10/09/2021   Procedure: RIGHT SHOULDER OPEN REDUCTION INTERNAL FIXATION (ORIF) PROXIMAL HUMERUS FRACTURE;  Surgeon: Meredith Pel, MD;  Location: [place];  Service: Orthopedics;  Laterality: Right;   TONSILLECTOMY     Patient Active Problem List   Diagnosis Date Noted   Closed fracture of proximal end of right humerus with routine healing     REFERRING DIAG:  S42.291A (ICD-10-CM) - Other closed displaced fracture of proximal end of right humerus, initial encounter  THERAPY DIAG:  Acute pain of right shoulder  Stiffness of right shoulder, not elsewhere classified  Muscle weakness (generalized)  Rationale for Evaluation and Treatment Rehabilitation  PERTINENT HISTORY: allergy, hyperlipidemia, osteoporosis, thyroid disease  PRECAUTIONS: no lifting more than 5 pounds  SUBJECTIVE: She tried some of positioning for sleep that seemed to help. She did not feels at stiff.   PAIN:  Are you having pain? Yes: NPRS scale: 0/10 Pain location: Rt shoulder Pain description: achy Aggravating factors: certain movements Relieving factors: changing positions   OBJECTIVE: (objective measures completed at initial evaluation  unless otherwise dated)  DIAGNOSTIC FINDINGS:  Performed on 11/07/21 : AP axillary lateral outlet radiographs right shoulder reviewed.  Hardware  unchanged in position and alignment for proximal humerus fracture.  No  lucencies around the pegs or screws.  No additional fracture or  dislocation.   PATIENT SURVEYS:   11/17/21: FOTO 61% (predicted 72% )   COGNITION: 11/17/21: Overall cognitive status: Within functional limits for tasks assessed                                  SENSATION: WFL   POSTURE: 11/17/21: Rounded shoulders, forward head   UPPER EXTREMITY ROM:     ROM Right 11/17/21 Active Left 11/17/21 Active Rt 11/24/21 Active Right 12/01/21 active  Shoulder flexion 140 164 155 supine 174* Seated 175* Glenohumeral Motion 136*  Shoulder extension 30 40    Shoulder abduction 128 170 138 Supine 180* Seated 176* Glenohumeral Motion 125*  Shoulder adduction        Shoulder internal rotation 70 c shoulder abd 45 deg 76 c shoulder abd 45 deg 70 c shoulder abd 45 deg Supine abd 45* 79*   Shoulder external rotation 70 c shoulder abd 45 deg 82 c shoulder abd 45 deg 76 c shoulder abd 45 deg Supine abd 45* 78*  Elbow flexion 145 148    Elbow extension 0 0    Wrist flexion  Wrist extension        Wrist ulnar deviation        Wrist radial deviation        Wrist pronation        Wrist supination        (Blank rows = not tested)   UPPER EXTREMITY MMT:   MMT Right eval Left eval  Shoulder flexion 3/5 5/5  Shoulder extension 3/5 5/5  Shoulder abduction 3/5 5/5  Shoulder adduction      Shoulder internal rotation 3/5 5/5  Shoulder external rotation 3/5 5/5  Middle trapezius      Lower trapezius      Elbow flexion      Elbow extension      Wrist flexion      Wrist extension      Wrist ulnar deviation      Wrist radial deviation      Wrist pronation      Wrist supination      Grip strength (lbs)      (Blank rows = not tested)   FUNCTIONAL  TESTING 11/27/21 Assessed AROM/PROM of wrist pronation, supination, flexion, extension for lateral elbow pain - all motions WNL and pain-free   PALPATION:  11/17/21 TTP along biceps tendon              TODAY'S TREATMENT:  12/03/2021 TherEx:  Pulleys: flexion x 3 minutes, scaption x 3 minutes with verbal cueing to look at hand during upward motion and hand position Standing UE Ranger: flexion x 2 minutes, scaption x 2 minutes with verbal cueing for anterior step and looking at hand Standing shoulder extension red band 3 x 10 reps Rows shoulder red theraband 3 sets 10 reps Elbow ext red theraband 10 reps ea set, 1st set neutral pronation /supination, 2nd set pronated, 3rd set supinated Moving cup to lower shelf- chest level 10 reps, middle shelf - eye level 10 reps, upper shelf - overhead 10 reps Standing shoulder abduction with scapular protraction end range 15 reps. Standing internal rotation reaching back of hand to left waist band 10 reps 1st set, 2nd set added forearm supination at end range 10 reps, 3rd set added reaching upward with shoulder rotation / elbow flexion target bra strap 10 reps.  Standing external rotation reaching hand behind head as low on spine as actively possible 10 reps 3 sets.  Back to wall reaching RUE laterally with protraction and horizontal adduction across body with end range protraction 15 reps.   Pt requested exercises that she did in PT today.  Pt printed copy of above and pt verbalized understanding.  She sees Dr. Marlou Sa Friday and will discuss shoulder issues including return to work (trying for 8/28 return to work.)  12/01/2021: TherEx:  Pulleys: flexion x 3 minutes, scaption x 3 minutes with verbal cueing to look at hand during upward motion and hand position Standing UE Ranger: flexion x 2 minutes, scaption x 2 minutes with verbal cueing for anterior step and looking at hand Standing shoulder extension red band 2 x 10 reps Elbow PROM stretch with active  supination and pronation. Pt verbalized understanding for home. Seated antigravity ROM as noted in objective section  Self Care: PT explained and demo sleeping set up for shoulder comfort, pt verbalized understanding  11/27/2021: TherEx:  Pulleys: flexion x 3 minutes, scaption x 3 minutes with verbal cueing to look at hand during upward motion Standing UE Ranger: flexion x 1 minute, scaption x 1 minute with verbal cueing  for anterior step and looking at hand Standing tabletop towel reaching x 15 flexion, x 15 M-L with adduction past midline, x 5 alternating counterclockwise/clockwise spirals small to large to small. Added to HEP via pt instructions with pt given handout. Pt verbalized understanding.  Standing ER isometrics x 10 with 5s hold with some lateral elbow pain Rows: green band x 5 with cueing for standing form and home set up, pt given new band with knot Seated ER with dowel x 10 with 3s hold Seated AROM shoulder extension x 5 with cueing for upright posture Seated AROM shoulder extension and IR reaching along waistband x 20, pt reports stretch loosening and tolerable motion  11/24/2021:  TherEx:  Pulleys: flexion x 3 minutes, scaption x 3 minutes UE Ranger: flexion x 1 minute, scaption x 1 minute, circles both directions x 1 minute  Wall ladder: flexion x 5 (# 27) Wall laddder scaption x 5  (# 26) Rows: Level 4 band x 20 Walking  yellow physioball up the wall x 10 holding 5 seconds each Isometics: flexion/extension/IR/ER x 5 each holding 5 seconds Supine shoulder flexion AAROM 2 x 10 holding 5 sec Supine Shoulder EX AAROM 2 x 10 holding 5 seconds  Manual: PROM Rt shoulder all planes    PATIENT EDUCATION: Education details: PT POC, HEP Person educated: Patient Education method: Consulting civil engineer, Demonstration, Tactile cues, Verbal cues, and Handouts Education comprehension: verbalized understanding and returned demonstration     HOME EXERCISE PROGRAM: Access Code:  [email or web address] URL: [email or web address] Date: 11/27/2021 Prepared by: Jamey Reas  Exercises - Supine Shoulder Flexion Extension AAROM with Dowel  - 2-3 x daily - 7 x weekly - 2 sets - 10 reps - 3-5 seconds hold - Supine Shoulder External Rotation in 45 Degrees Abduction AAROM with Dowel  - 2-3 x daily - 7 x weekly - 2 sets - 10 reps - 3-5 seconds hold - Seated Shoulder External Rotation AAROM with Dowel  - 2-3 x daily - 7 x weekly - 2 sets - 10 reps - 3-5 seconds hold - Shoulder Internal Rotation and Extension  - 2-3 x daily - 7 x weekly - 2 sets - 10 reps - 5 seconds hold - Isometric Shoulder Flexion at Wall  - 2 x daily - 7 x weekly - 10 reps - 5 seconds hold - Isometric Shoulder Extension at Wall  - 2 x daily - 7 x weekly - 10 reps - 5 seconds hold - Standing Isometric Shoulder External Rotation with Doorway  - 2 x daily - 7 x weekly - 10 reps - 5 seconds hold - Standing Isometric Shoulder Internal Rotation with Towel Roll at Doorway  - 2 x daily - 7 x weekly - 10 reps - 5 seconds hold - Standing Row with Anchored Resistance  - 2 x daily - 7 x weekly - 2 sets - 10 reps - 3 seconds hold  11/27/2021 added TableTop Towel Wipes Standing in front of a table or countertop with a towel under your Right hand and your Left hand planted on the table surface directly under your shoulder 15 times reaching straight forward and back 15 times reaching across side to side in front of Left hand 5 times each direction spiraling circles staring small and going larger then smaller again, alternate clockwise and counterclockwise   ASSESSMENT:   CLINICAL IMPRESSION: Patient has made excellent progress with active shoulder range improving function.  PT has limited resistance especially in rotation until she sees Dr. Marlou Sa on  8/18 for clearance >5#.  She would benefit from further PT to progress shoulder to full strength including work related tasks.     OBJECTIVE IMPAIRMENTS decreased ROM,  decreased strength, increased edema, impaired UE functional use, and pain.    ACTIVITY LIMITATIONS carrying, lifting, sleeping, dressing, and reach over head   PARTICIPATION LIMITATIONS: cleaning, laundry, shopping, and community activity   PERSONAL FACTORS allergy, hyperlipidemia, osteoporosis, thyroid disease are also affecting patient's functional outcome.    REHAB POTENTIAL: Good   CLINICAL DECISION MAKING: Stable/uncomplicated   EVALUATION COMPLEXITY: Low     GOALS: Goals reviewed with patient? Yes   SHORT TERM GOALS: Target date: 12/05/21  (Remove Blue Hyperlink) Patient will demonstrate independent use of initial home exercise program to maintain progress from in clinic treatments. Goal status: met for HEP to date 12/01/21     LONG TERM GOALS: Target date: 01/03/2022  (Remove Blue Hyperlink) Patient will demonstrate/report pain at worst less than or equal to 2/10 to facilitate minimal limitation in daily activity secondary to pain symptoms. Goal status: New   Patient will demonstrate independent use of home exercise program to facilitate ability to maintain/progress functional gains from skilled physical therapy services. Goal status: New   Patient will demonstrate FOTO outcome > or = 78 % to indicate reduced disability due to condition. Goal status: New   Pt will improve her Rt shoulder strength to grossly 4/5 in order to improve functional mobility.  Goal status: New       5.  Pt will be able to lift 5 # from counter to over head shelf c no pain.   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  Update HEP.     Jamey Reas, PT, DPT 12/03/2021, 10:25 AM

## 2021-12-03 NOTE — Patient Instructions (Signed)
TherEx:  Pulleys: flexion x 3 minutes, scaption x 3 minutes with verbal cueing to look at hand during upward motion and hand position Standing UE Ranger: flexion x 2 minutes, scaption x 2 minutes with verbal cueing for anterior step and looking at hand Standing shoulder extension red band 3 x 10 reps Rows shoulder red theraband 3 sets 10 reps Elbow ext red theraband 10 reps ea set, 1st set neutral pronation /supination, 2nd set pronated, 3rd set supinated Moving cup to lower shelf- chest level 10 reps, middle shelf - eye level 10 reps, upper shelf - overhead 10 reps Standing shoulder abduction with scapular protraction end range 15 reps. Standing internal rotation reaching back of hand to left waist band 10 reps 1st set, 2nd set added forearm supination at end range 10 reps, 3rd set added reaching upward with shoulder rotation / elbow flexion target bra strap 10 reps.  Standing external rotation reaching hand behind head as low on spine as actively possible 10 reps 3 sets.  Back to wall reaching RUE laterally with protraction and horizontal adduction across body with end range protraction 15 reps.

## 2021-12-05 ENCOUNTER — Ambulatory Visit (INDEPENDENT_AMBULATORY_CARE_PROVIDER_SITE_OTHER): Payer: Federal, State, Local not specified - PPO

## 2021-12-05 ENCOUNTER — Ambulatory Visit (INDEPENDENT_AMBULATORY_CARE_PROVIDER_SITE_OTHER): Payer: Federal, State, Local not specified - PPO | Admitting: Surgical

## 2021-12-05 DIAGNOSIS — S42291A Other displaced fracture of upper end of right humerus, initial encounter for closed fracture: Secondary | ICD-10-CM

## 2021-12-07 ENCOUNTER — Encounter: Payer: Self-pay | Admitting: Orthopedic Surgery

## 2021-12-07 NOTE — Progress Notes (Signed)
Post-Op Visit Note   Patient: Alyssa Estrada           Date of Birth: 1960/06/10           MRN: [id number] Visit Date: 12/05/2021 PCP: Carol Ada, MD   Assessment & Plan:  Chief Complaint:  Chief Complaint  Patient presents with   Right Shoulder - Routine Post Op    right proximal humerus fracture open reduction internal fixation performed 10/09/2021.   Visit Diagnoses:  1. Other closed displaced fracture of proximal end of right humerus, initial encounter     Plan: Patient is a 61 year old female who presents s/p ORIF of right proximal humerus fracture on 10/09/2021.  She is going to physical therapy 2 times per week and overall is doing well.  Not taking any medications for pain.  She does have some increased soreness at night more recently last couple days after she started going behind her back in the same timeframe.  Taking vitamin D and calcium.  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  Her schedule currently is working 1 week for 4 to 5 days and then she will be off for 3 weeks after that.  On exam, patient has excellent range of motion for 2 months out from procedure with 35 degrees external rotation, 110 degrees abduction, 180 degrees forward flexion both passively and actively.  Incision is healing well without evidence of infection or dehiscence.  Intact EPL, FPL, finger abduction, finger adduction, grip strength testing, wrist extension, pronation/supination, bicep, tricep.  Axillary nerve is intact with deltoid firing.  Excellent rotator cuff strength of supra, infra, subscap with no real hint of weakness.  Plan is to continue with plan to return to work but only working supine cases only so that we limit how much she has to lift and assist with moving the patient.  Continue with 5 pound lifting restriction.  Radiographs taken today of the right shoulder do demonstrate a little bit of increased callus formation primarily around  the inferior aspect of the fracture site but there is definitely continued fracture line lucency that is noted.  There is no evidence of hardware failure at this point.  Suspect that with her excellent clinical improvement and the hint of callus formation noted on radiographs, she is healing the fracture without issue but want to check new radiographs in 6 weeks and hold off on any heavy lifting until then.  Follow-Up Instructions: No follow-ups on file.   Orders:  Orders Placed This Encounter  Procedures   XR Shoulder Right   No orders of the defined types were placed in this encounter.   Imaging: No results found.  PMFS History: Patient Active Problem List   Diagnosis Date Noted   Closed fracture of proximal end of right humerus with routine healing    Past Medical History:  Diagnosis Date   Allergy    Hyperlipidemia    Osteoporosis    osteopenia   Thyroid disease     Family History  Problem Relation Age of Onset   Pancreatic cancer Father    Pancreatic cancer Paternal Grandmother    Colon cancer Neg Hx    Colon polyps Neg Hx    Esophageal cancer Neg Hx    Stomach cancer Neg Hx    Rectal cancer Neg Hx     Past Surgical History:  Procedure Laterality Date   ORIF HUMERUS FRACTURE Right 10/09/2021   Procedure:  RIGHT SHOULDER OPEN REDUCTION INTERNAL FIXATION (ORIF) PROXIMAL HUMERUS FRACTURE;  Surgeon: Meredith Pel, MD;  Location: [place];  Service: Orthopedics;  Laterality: Right;   TONSILLECTOMY     Social History   Occupational History   Not on file  Tobacco Use   Smoking status: Never   Smokeless tobacco: Never  Vaping Use   Vaping Use: Never used  Substance and Sexual Activity   Alcohol use: Yes    Comment: once every couple of months/social   Drug use: No   Sexual activity: Not on file

## 2021-12-08 ENCOUNTER — Ambulatory Visit (INDEPENDENT_AMBULATORY_CARE_PROVIDER_SITE_OTHER): Payer: Federal, State, Local not specified - PPO | Admitting: Physical Therapy

## 2021-12-08 ENCOUNTER — Encounter: Payer: Self-pay | Admitting: Physical Therapy

## 2021-12-08 DIAGNOSIS — M25611 Stiffness of right shoulder, not elsewhere classified: Secondary | ICD-10-CM

## 2021-12-08 DIAGNOSIS — M6281 Muscle weakness (generalized): Secondary | ICD-10-CM

## 2021-12-08 DIAGNOSIS — M25511 Pain in right shoulder: Secondary | ICD-10-CM

## 2021-12-08 NOTE — Therapy (Signed)
OUTPATIENT PHYSICAL THERAPY TREATMENT NOTE   Patient Name: Alyssa Estrada MRN: [id number] DOB:16-Oct-1960, 61 y.o., female Today's Date: 12/08/2021  PCP: Carol Ada MD  REFERRING PROVIDER: Meredith Pel, MD  END OF SESSION:   PT End of Session - 12/08/21 1320     Visit Number 6    Number of Visits 8    Date for PT Re-Evaluation 12/19/21    PT Start Time 1100    PT Stop Time 1145    PT Time Calculation (min) 45 min    Activity Tolerance Patient tolerated treatment well    Behavior During Therapy [facility] for tasks assessed/performed              Past Medical History:  Diagnosis Date   Allergy    Hyperlipidemia    Osteoporosis    osteopenia   Thyroid disease    Past Surgical History:  Procedure Laterality Date   ORIF HUMERUS FRACTURE Right 10/09/2021   Procedure: RIGHT SHOULDER OPEN REDUCTION INTERNAL FIXATION (ORIF) PROXIMAL HUMERUS FRACTURE;  Surgeon: Meredith Pel, MD;  Location: [place];  Service: Orthopedics;  Laterality: Right;   TONSILLECTOMY     Patient Active Problem List   Diagnosis Date Noted   Closed fracture of proximal end of right humerus with routine healing     REFERRING DIAG:  S42.291A (ICD-10-CM) - Other closed displaced fracture of proximal end of right humerus, initial encounter  THERAPY DIAG:  Acute pain of right shoulder  Stiffness of right shoulder, not elsewhere classified  Muscle weakness (generalized)  Rationale for Evaluation and Treatment Rehabilitation  PERTINENT HISTORY: allergy, hyperlipidemia, osteoporosis, thyroid disease  PRECAUTIONS: no lifting more than 5 pounds  SUBJECTIVE:  Pt arriving today reporting no pain at rest, Pt reporting 2-3/10 pain with movements. Pt concerned after seeing PA last week that her Rt shoulder was not healing as expected. Pt reporting her arm was doing much better and feels she may have over done it. Pt also reporting the new IR stretch may have aggravated her shoulder.   PAIN:  Are  you having pain? Yes: NPRS scale: 0/10 at rest, 2-3/10 with movements Pain location: Rt shoulder Pain description: achy Aggravating factors: certain movements Relieving factors: changing positions   OBJECTIVE: (objective measures completed at initial evaluation unless otherwise dated)  DIAGNOSTIC FINDINGS:  Performed on 11/07/21 : AP axillary lateral outlet radiographs right shoulder reviewed.  Hardware  unchanged in position and alignment for proximal humerus fracture.  No  lucencies around the pegs or screws.  No additional fracture or  dislocation.   PATIENT SURVEYS:   11/17/21: FOTO 61% (predicted 72% )   COGNITION: 11/17/21: Overall cognitive status: Within functional limits for tasks assessed                                  SENSATION: WFL   POSTURE: 11/17/21: Rounded shoulders, forward head   UPPER EXTREMITY ROM:     ROM Right 11/17/21 Active Left 11/17/21 Active Rt 11/24/21 Active Right 12/01/21 active Rt 12/08/21 Active  Shoulder flexion 140 164 155 supine 174* Seated 175* Glenohumeral Motion 136* Supine 175  Shoulder extension 30 40     Shoulder abduction 128 170 138 Supine 180* Seated 176* Glenohumeral Motion 125* Supine 176  Shoulder adduction         Shoulder internal rotation 70 c shoulder abd 45 deg 76 c shoulder abd 45 deg 70 c shoulder abd  45 deg Supine abd 45* 79*    Shoulder external rotation 70 c shoulder abd 45 deg 82 c shoulder abd 45 deg 76 c shoulder abd 45 deg Supine abd 45* 78* Supine 75 deg, shoulder abd 45 deg  Elbow flexion 145 148     Elbow extension 0 0     Wrist flexion         Wrist extension         Wrist ulnar deviation         Wrist radial deviation         Wrist pronation         Wrist supination         (Blank rows = not tested)   UPPER EXTREMITY MMT:   MMT Right eval Left eval  Shoulder flexion 3/5 5/5  Shoulder extension 3/5 5/5  Shoulder abduction 3/5 5/5  Shoulder adduction      Shoulder internal rotation  3/5 5/5  Shoulder external rotation 3/5 5/5  Middle trapezius      Lower trapezius      Elbow flexion      Elbow extension      Wrist flexion      Wrist extension      Wrist ulnar deviation      Wrist radial deviation      Wrist pronation      Wrist supination      Grip strength (lbs)      (Blank rows = not tested)   FUNCTIONAL TESTING 11/27/21 Assessed AROM/PROM of wrist pronation, supination, flexion, extension for lateral elbow pain - all motions WNL and pain-free   PALPATION:  11/17/21 TTP along biceps tendon              TODAY'S TREATMENT:  12/08/21:  TherEx:  Pulleys: flexion x 2 minutes, abduction x 2 minutes Wall ladder x 10 in scaption UE ranger circles: x 1 minute each direction UE ranger scaption x 15 Rt UE only ER and IR holding elbow at 90 degrees sidestepping x 15  Rows: Level 3 band x 20 holding 3 seconds Supine shoulder flexion using 2 # therapy ball c bil UE's Supine AAROM ER x 12 holding 3 sec Sidelying ER: x 15 c 1 # wt Sidelying Abd x 15 c 1# wt Manual: STM to Rt shoulder and Rt forearm x 8 minutes    12/03/2021 TherEx:  Pulleys: flexion x 3 minutes, scaption x 3 minutes with verbal cueing to look at hand during upward motion and hand position Standing UE Ranger: flexion x 2 minutes, scaption x 2 minutes with verbal cueing for anterior step and looking at hand Standing shoulder extension red band 3 x 10 reps Rows shoulder red theraband 3 sets 10 reps Elbow ext red theraband 10 reps ea set, 1st set neutral pronation /supination, 2nd set pronated, 3rd set supinated Moving cup to lower shelf- chest level 10 reps, middle shelf - eye level 10 reps, upper shelf - overhead 10 reps Standing shoulder abduction with scapular protraction end range 15 reps. Standing internal rotation reaching back of hand to left waist band 10 reps 1st set, 2nd set added forearm supination at end range 10 reps, 3rd set added reaching upward with shoulder rotation / elbow flexion  target bra strap 10 reps.  Standing external rotation reaching hand behind head as low on spine as actively possible 10 reps 3 sets.  Back to wall reaching RUE laterally with protraction and horizontal adduction across body with  end range protraction 15 reps.   Pt requested exercises that she did in PT today.  Pt printed copy of above and pt verbalized understanding.  She sees Dr. Marlou Sa Friday and will discuss shoulder issues including return to work (trying for 8/28 return to work.)  12/01/2021: TherEx:  Pulleys: flexion x 3 minutes, scaption x 3 minutes with verbal cueing to look at hand during upward motion and hand position Standing UE Ranger: flexion x 2 minutes, scaption x 2 minutes with verbal cueing for anterior step and looking at hand Standing shoulder extension red band 2 x 10 reps Elbow PROM stretch with active supination and pronation. Pt verbalized understanding for home. Seated antigravity ROM as noted in objective section  Self Care: PT explained and demo sleeping set up for shoulder comfort, pt verbalized understanding       PATIENT EDUCATION: Education details: PT POC, HEP Person educated: Patient Education method: Explanation, Demonstration, Tactile cues, Verbal cues, and Handouts Education comprehension: verbalized understanding and returned demonstration     HOME EXERCISE PROGRAM: Access Code: [email or web address] URL: [email or web address] Date: 12/08/2021 Prepared by: Kearney Hard  Exercises - Supine Shoulder Flexion Extension AAROM with Dowel  - 2-3 x daily - 7 x weekly - 2 sets - 10 reps - 3-5 seconds hold - Supine Shoulder External Rotation in 45 Degrees Abduction AAROM with Dowel  - 2-3 x daily - 7 x weekly - 2 sets - 10 reps - 3-5 seconds hold - Seated Shoulder External Rotation AAROM with Dowel  - 2-3 x daily - 7 x weekly - 2 sets - 10 reps - 3-5 seconds hold - Shoulder Internal Rotation and Extension  - 2-3 x daily - 7 x weekly - 2 sets - 10  reps - 5 seconds hold - Isometric Shoulder Flexion at Wall  - 2 x daily - 7 x weekly - 10 reps - 5 seconds hold - Isometric Shoulder Extension at Wall  - 2 x daily - 7 x weekly - 10 reps - 5 seconds hold - Standing Isometric Shoulder External Rotation with Doorway  - 2 x daily - 7 x weekly - 10 reps - 5 seconds hold - Standing Isometric Shoulder Internal Rotation with Towel Roll at Doorway  - 2 x daily - 7 x weekly - 10 reps - 5 seconds hold - Standing Row with Anchored Resistance  - 2 x daily - 7 x weekly - 2 sets - 10 reps - 3 seconds hold - Sidelying Shoulder ER with Towel and Dumbbell  - 1-2 x daily - 7 x weekly - 2 sets - 10 reps - Sidelying Shoulder Abduction Palm Forward  - 1-2 x daily - 7 x weekly - 2 sets - 10 reps  11/27/2021 added TableTop Towel Wipes Standing in front of a table or countertop with a towel under your Right hand and your Left hand planted on the table surface directly under your shoulder 15 times reaching straight forward and back 15 times reaching across side to side in front of Left hand 5 times each direction spiraling circles staring small and going larger then smaller again, alternate clockwise and counterclockwise   ASSESSMENT:   CLINICAL IMPRESSION: Pt reporting more soreness/pain today in Rt shoulder. Pt reporting some relief following STM for tightness noted in Rt deltoids and bicep tendon and anterior shoulder.Exercises were tolerated well. Pt's overall shoulder ROM has improved since beginning therapy. Pt still with 5# limitation.  Pt's HEP was updated. Continue skilled PT to pt's tolerance.  OBJECTIVE IMPAIRMENTS decreased ROM, decreased strength, increased edema, impaired UE functional use, and pain.    ACTIVITY LIMITATIONS carrying, lifting, sleeping, dressing, and reach over head   PARTICIPATION LIMITATIONS: cleaning, laundry, shopping, and community activity   PERSONAL FACTORS allergy, hyperlipidemia, osteoporosis, thyroid disease are also  affecting patient's functional outcome.    REHAB POTENTIAL: Good   CLINICAL DECISION MAKING: Stable/uncomplicated   EVALUATION COMPLEXITY: Low     GOALS: Goals reviewed with patient? Yes   SHORT TERM GOALS: Target date: 12/05/21  (Remove Blue Hyperlink) Patient will demonstrate independent use of initial home exercise program to maintain progress from in clinic treatments. Goal status: met for HEP to date 12/01/21     LONG TERM GOALS: Target date: 01/03/2022  (Remove Blue Hyperlink) Patient will demonstrate/report pain at worst less than or equal to 2/10 to facilitate minimal limitation in daily activity secondary to pain symptoms. Goal status: New   Patient will demonstrate independent use of home exercise program to facilitate ability to maintain/progress functional gains from skilled physical therapy services. Goal status: New   Patient will demonstrate FOTO outcome > or = 78 % to indicate reduced disability due to condition. Goal status: New   Pt will improve her Rt shoulder strength to grossly 4/5 in order to improve functional mobility.  Goal status: New       5.  Pt will be able to lift 5 # from counter to over head shelf c no pain.   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  Update HEP.     Oretha Caprice, PT, MPT 12/08/2021, 1:23 PM

## 2021-12-10 ENCOUNTER — Ambulatory Visit (INDEPENDENT_AMBULATORY_CARE_PROVIDER_SITE_OTHER): Payer: Federal, State, Local not specified - PPO | Admitting: Physical Therapy

## 2021-12-10 ENCOUNTER — Encounter: Payer: Self-pay | Admitting: Physical Therapy

## 2021-12-10 DIAGNOSIS — M25511 Pain in right shoulder: Secondary | ICD-10-CM | POA: Diagnosis not present

## 2021-12-10 DIAGNOSIS — M25611 Stiffness of right shoulder, not elsewhere classified: Secondary | ICD-10-CM

## 2021-12-10 DIAGNOSIS — M6281 Muscle weakness (generalized): Secondary | ICD-10-CM

## 2021-12-10 NOTE — Therapy (Signed)
OUTPATIENT PHYSICAL THERAPY TREATMENT NOTE   Patient Name: Alyssa Estrada MRN: [id number] DOB:03/11/1961, 61 y.o., female Today's Date: 12/10/2021  PCP: Carol Ada MD  REFERRING PROVIDER: Meredith Pel, MD  END OF SESSION:   PT End of Session - 12/10/21 0911     Visit Number 7    Number of Visits 8    Date for PT Re-Evaluation 12/19/21    PT Start Time 0850    PT Stop Time 0930    PT Time Calculation (min) 40 min    Activity Tolerance Patient tolerated treatment well    Behavior During Therapy [facility] for tasks assessed/performed               Past Medical History:  Diagnosis Date   Allergy    Hyperlipidemia    Osteoporosis    osteopenia   Thyroid disease    Past Surgical History:  Procedure Laterality Date   ORIF HUMERUS FRACTURE Right 10/09/2021   Procedure: RIGHT SHOULDER OPEN REDUCTION INTERNAL FIXATION (ORIF) PROXIMAL HUMERUS FRACTURE;  Surgeon: Meredith Pel, MD;  Location: [place];  Service: Orthopedics;  Laterality: Right;   TONSILLECTOMY     Patient Active Problem List   Diagnosis Date Noted   Closed fracture of proximal end of right humerus with routine healing     REFERRING DIAG:  S42.291A (ICD-10-CM) - Other closed displaced fracture of proximal end of right humerus, initial encounter  THERAPY DIAG:  Acute pain of right shoulder  Muscle weakness (generalized)  Stiffness of right shoulder, not elsewhere classified  Rationale for Evaluation and Treatment Rehabilitation  PERTINENT HISTORY: allergy, hyperlipidemia, osteoporosis, thyroid disease  PRECAUTIONS: no lifting more than 5 pounds  SUBJECTIVE:  Pt arriving today reporting no pain at rest, Pt reporting 2-3/10 pain with movements. Pt concerned after seeing PA last week that her Rt shoulder was not healing as expected. Pt reporting her arm was doing much better and feels she may have over done it. Pt also reporting the new IR stretch may have aggravated her shoulder.   PAIN:  Are  you having pain? Yes: NPRS scale: 0/10 at rest, 2-3/10 with movements Pain location: Rt shoulder Pain description: achy Aggravating factors: certain movements Relieving factors: changing positions   OBJECTIVE: (objective measures completed at initial evaluation unless otherwise dated)  DIAGNOSTIC FINDINGS:  Performed on 11/07/21 : AP axillary lateral outlet radiographs right shoulder reviewed.  Hardware  unchanged in position and alignment for proximal humerus fracture.  No  lucencies around the pegs or screws.  No additional fracture or  dislocation.   PATIENT SURVEYS:               11/17/21: FOTO 61% (predicted 72% )   COGNITION: 11/17/21: Overall cognitive status: Within functional limits for tasks assessed                                  SENSATION: WFL   POSTURE: 11/17/21: Rounded shoulders, forward head   UPPER EXTREMITY ROM:     ROM Right 11/17/21 Active Left 11/17/21 Active Rt 11/24/21 Active Right 12/01/21 active Rt 12/08/21 Active Rt 12/10/21  Shoulder flexion 140 164 155 supine 174* Seated 175* Glenohumeral Motion 136* Supine 175 Supine 174  Shoulder extension 30 40      Shoulder abduction 128 170 138 Supine 180* Seated 176* Glenohumeral Motion 125* Supine 176 Supine 174  Shoulder adduction  Shoulder internal rotation 70 c shoulder abd 45 deg 76 c shoulder abd 45 deg 70 c shoulder abd 45 deg Supine abd 45* 79*     Shoulder external rotation 70 c shoulder abd 45 deg 82 c shoulder abd 45 deg 76 c shoulder abd 45 deg Supine abd 45* 78* Supine 75 deg, shoulder abd 45 deg Supine 75 deg shoulder abd 45 deg  Elbow flexion 145 148      Elbow extension 0 0      Wrist flexion          Wrist extension          Wrist ulnar deviation          Wrist radial deviation          Wrist pronation          Wrist supination          (Blank rows = not tested)   UPPER EXTREMITY MMT:   MMT Right eval Left eval  Shoulder flexion 3/5 5/5  Shoulder  extension 3/5 5/5  Shoulder abduction 3/5 5/5  Shoulder adduction      Shoulder internal rotation 3/5 5/5  Shoulder external rotation 3/5 5/5  Middle trapezius      Lower trapezius      Elbow flexion      Elbow extension      Wrist flexion      Wrist extension      Wrist ulnar deviation      Wrist radial deviation      Wrist pronation      Wrist supination      Grip strength (lbs)      (Blank rows = not tested)   FUNCTIONAL TESTING 11/27/21 Assessed AROM/PROM of wrist pronation, supination, flexion, extension for lateral elbow pain - all motions WNL and pain-free   PALPATION:  11/17/21 TTP along biceps tendon              TODAY'S TREATMENT:  12/10/21:  TherEx:  Pulleys: flexion x 2 minutes, abduction x 2 minutes UBE Level 1 x  3 minutes each direction Lifting 2 # weight to shoulder height shelf Lifting small cone to shelf that is above shoulder height Rows: Level 3 band x 20 holding 3 seconds Supine shoulder flexion using 2 # therapy ball c bil UE's Supine AAROM ER x 12 holding 3 sec Sidelying ER: x 20 c 2 # wt (with muscle fatigue reported) Sidelying Abd x 20 c 1# wt (with muscle fatigue reported)  12/08/21:  TherEx:  Pulleys: flexion x 2 minutes, abduction x 2 minutes Wall ladder x 10 in scaption UE ranger circles: x 1 minute each direction UE ranger scaption x 15 Rt UE only ER and IR holding elbow at 90 degrees sidestepping x 15  Rows: Level 3 band x 20 holding 3 seconds Supine shoulder flexion using 2 # therapy ball c bil UE's Supine AAROM ER x 12 holding 3 sec Sidelying ER: x 15 c 1 # wt Sidelying Abd x 15 c 1# wt Manual: STM to Rt shoulder and Rt forearm x 8 minutes    12/03/2021 TherEx:  Pulleys: flexion x 3 minutes, scaption x 3 minutes with verbal cueing to look at hand during upward motion and hand position Standing UE Ranger: flexion x 2 minutes, scaption x 2 minutes with verbal cueing for anterior step and looking at hand Standing shoulder extension red  band 3 x 10 reps Rows shoulder red theraband 3 sets 10  reps Elbow ext red theraband 10 reps ea set, 1st set neutral pronation /supination, 2nd set pronated, 3rd set supinated Moving cup to lower shelf- chest level 10 reps, middle shelf - eye level 10 reps, upper shelf - overhead 10 reps Standing shoulder abduction with scapular protraction end range 15 reps. Standing internal rotation reaching back of hand to left waist band 10 reps 1st set, 2nd set added forearm supination at end range 10 reps, 3rd set added reaching upward with shoulder rotation / elbow flexion target bra strap 10 reps.  Standing external rotation reaching hand behind head as low on spine as actively possible 10 reps 3 sets.  Back to wall reaching RUE laterally with protraction and horizontal adduction across body with end range protraction 15 reps.   Pt requested exercises that she did in PT today.  Pt printed copy of above and pt verbalized understanding.  She sees Dr. Marlou Sa Friday and will discuss shoulder issues including return to work (trying for 8/28 return to work.)      PATIENT EDUCATION: Education details: PT POC, HEP Person educated: Patient Education method: Consulting civil engineer, Media planner, Corporate treasurer cues, Verbal cues, and Handouts Education comprehension: verbalized understanding and returned demonstration     HOME EXERCISE PROGRAM: Access Code: [email or web address] URL: [email or web address] Date: 12/08/2021 Prepared by: Kearney Hard  Exercises - Supine Shoulder Flexion Extension AAROM with Dowel  - 2-3 x daily - 7 x weekly - 2 sets - 10 reps - 3-5 seconds hold - Supine Shoulder External Rotation in 45 Degrees Abduction AAROM with Dowel  - 2-3 x daily - 7 x weekly - 2 sets - 10 reps - 3-5 seconds hold - Seated Shoulder External Rotation AAROM with Dowel  - 2-3 x daily - 7 x weekly - 2 sets - 10 reps - 3-5 seconds hold - Shoulder Internal Rotation and Extension  - 2-3 x daily - 7 x weekly - 2 sets - 10  reps - 5 seconds hold - Isometric Shoulder Flexion at Wall  - 2 x daily - 7 x weekly - 10 reps - 5 seconds hold - Isometric Shoulder Extension at Wall  - 2 x daily - 7 x weekly - 10 reps - 5 seconds hold - Standing Isometric Shoulder External Rotation with Doorway  - 2 x daily - 7 x weekly - 10 reps - 5 seconds hold - Standing Isometric Shoulder Internal Rotation with Towel Roll at Doorway  - 2 x daily - 7 x weekly - 10 reps - 5 seconds hold - Standing Row with Anchored Resistance  - 2 x daily - 7 x weekly - 2 sets - 10 reps - 3 seconds hold - Sidelying Shoulder ER with Towel and Dumbbell  - 1-2 x daily - 7 x weekly - 2 sets - 10 reps - Sidelying Shoulder Abduction Palm Forward  - 1-2 x daily - 7 x weekly - 2 sets - 10 reps     ASSESSMENT:   CLINICAL IMPRESSION: Pt arriving to therapy reporting no pain. We began a little more strengthening today using 1-2# weights with controlled mobility. Pt still limited to < 5 # lifting. Continue skilled PT to pt's tolerance.     OBJECTIVE IMPAIRMENTS decreased ROM, decreased strength, increased edema, impaired UE functional use, and pain.    ACTIVITY LIMITATIONS carrying, lifting, sleeping, dressing, and reach over head   PARTICIPATION LIMITATIONS: cleaning, laundry, shopping, and community activity   PERSONAL FACTORS allergy, hyperlipidemia, osteoporosis, thyroid disease are also affecting patient's functional outcome.  REHAB POTENTIAL: Good   CLINICAL DECISION MAKING: Stable/uncomplicated   EVALUATION COMPLEXITY: Low     GOALS: Goals reviewed with patient? Yes   SHORT TERM GOALS: Target date: 12/05/21  (Remove Blue Hyperlink) Patient will demonstrate independent use of initial home exercise program to maintain progress from in clinic treatments. Goal status: met for HEP to date 12/01/21     LONG TERM GOALS: Target date: 01/03/2022  (Remove Blue Hyperlink) Patient will demonstrate/report pain at worst less than or equal to 2/10 to  facilitate minimal limitation in daily activity secondary to pain symptoms. Goal status: On-going 12/10/21   Patient will demonstrate independent use of home exercise program to facilitate ability to maintain/progress functional gains from skilled physical therapy services. Goal status: On-going 12/10/21   Patient will demonstrate FOTO outcome > or = 78 % to indicate reduced disability due to condition. Goal status: New   Pt will improve her Rt shoulder strength to grossly 4/5 in order to improve functional mobility.  Goal status: On-going 12/10/21       5.  Pt will be able to lift 5 # from counter to over head shelf c no pain.   Goal status: ongoing: 12/10/21       PLAN: PT FREQUENCY: 2x/week   PT DURATION: 6 weeks   PLANNED INTERVENTIONS: Therapeutic exercises, Therapeutic activity, Neuromuscular re-education, Balance training, Gait training, Patient/Family education, Self Care, Joint mobilization, Dry Needling, Electrical stimulation, Cryotherapy, Vasopneumatic device, Ultrasound, and Manual therapy   PLAN FOR NEXT SESSION: Recert for 1x/ week for next 4-6 weeks, Continue with shoulder strength and functional mobility    Oretha Caprice, PT, MPT 12/10/2021, 9:53 AM

## 2021-12-15 ENCOUNTER — Ambulatory Visit (INDEPENDENT_AMBULATORY_CARE_PROVIDER_SITE_OTHER): Payer: Federal, State, Local not specified - PPO | Admitting: Rehabilitative and Restorative Service Providers"

## 2021-12-15 ENCOUNTER — Encounter: Payer: Federal, State, Local not specified - PPO | Admitting: Rehabilitative and Restorative Service Providers"

## 2021-12-15 ENCOUNTER — Encounter: Payer: Self-pay | Admitting: Rehabilitative and Restorative Service Providers"

## 2021-12-15 DIAGNOSIS — M6281 Muscle weakness (generalized): Secondary | ICD-10-CM | POA: Diagnosis not present

## 2021-12-15 DIAGNOSIS — M25511 Pain in right shoulder: Secondary | ICD-10-CM | POA: Diagnosis not present

## 2021-12-15 DIAGNOSIS — M25611 Stiffness of right shoulder, not elsewhere classified: Secondary | ICD-10-CM

## 2021-12-15 NOTE — Therapy (Signed)
OUTPATIENT PHYSICAL THERAPY TREATMENT NOTE/ PROGRESS NOTE / [place]   Patient Name: Alyssa Estrada MRN: [id number] DOB:1960-12-27, 61 y.o., female Today's Date: 12/15/2021  PCP: Carol Ada MD  REFERRING PROVIDER: Meredith Pel, MD  Progress Note Reporting Period 11/17/2021 to 12/15/2021  See note below for Objective Data and Assessment of Progress/Goals.   END OF SESSION:   PT End of Session - 12/15/21 1536     Visit Number 8    Number of Visits 12    Date for PT Re-Evaluation 01/12/22    PT Start Time 1513    PT Stop Time 1603    PT Time Calculation (min) 50 min    Activity Tolerance Patient tolerated treatment well    Behavior During Therapy [facility] for tasks assessed/performed                Past Medical History:  Diagnosis Date   Allergy    Hyperlipidemia    Osteoporosis    osteopenia   Thyroid disease    Past Surgical History:  Procedure Laterality Date   ORIF HUMERUS FRACTURE Right 10/09/2021   Procedure: RIGHT SHOULDER OPEN REDUCTION INTERNAL FIXATION (ORIF) PROXIMAL HUMERUS FRACTURE;  Surgeon: Meredith Pel, MD;  Location: [place];  Service: Orthopedics;  Laterality: Right;   TONSILLECTOMY     Patient Active Problem List   Diagnosis Date Noted   Closed fracture of proximal end of right humerus with routine healing     REFERRING DIAG:  S42.291A (ICD-10-CM) - Other closed displaced fracture of proximal end of right humerus, initial encounter  THERAPY DIAG:  Acute pain of right shoulder  Muscle weakness (generalized)  Stiffness of right shoulder, not elsewhere classified  Rationale for Evaluation and Treatment Rehabilitation  PERTINENT HISTORY: allergy, hyperlipidemia, osteoporosis, thyroid disease  PRECAUTIONS: no lifting more than 5 pounds  SUBJECTIVE:  Pt indicated feeling some complaints at times and soreness after work activity.  In general nothing major noted on pain scale.   PAIN:  NPRS scale: 0/10 upon arrival.  Pain  location: Rt shoulder Pain description: achy Aggravating factors: certain movements Relieving factors: changing positions   OBJECTIVE: (objective measures completed at initial evaluation unless otherwise dated)  DIAGNOSTIC FINDINGS:  Performed on 11/07/21 : AP axillary lateral outlet radiographs right shoulder reviewed.  Hardware  unchanged in position and alignment for proximal humerus fracture.  No  lucencies around the pegs or screws.  No additional fracture or  dislocation.   PATIENT SURVEYS:              12/15/2021:  FOTO update: 78%  11/17/21: FOTO 61% (predicted 72% )   COGNITION: 11/17/21: Overall cognitive status: Within functional limits for tasks assessed                                  SENSATION: WFL   POSTURE: 11/17/21: Rounded shoulders, forward head   UPPER EXTREMITY ROM:     ROM Right 11/17/21 Active Left 11/17/21 Active Rt 11/24/21 Active Right 12/01/21 active Rt 12/08/21 Active Rt 12/10/21  Shoulder flexion 140 164 155 supine 174* Seated 175* Glenohumeral Motion 136* Supine 175 Supine 174  Shoulder extension 30 40      Shoulder abduction 128 170 138 Supine 180* Seated 176* Glenohumeral Motion 125* Supine 176 Supine 174  Shoulder adduction          Shoulder internal rotation 70 c shoulder abd 45 deg 76 c shoulder  abd 45 deg 70 c shoulder abd 45 deg Supine abd 45* 79*     Shoulder external rotation 70 c shoulder abd 45 deg 82 c shoulder abd 45 deg 76 c shoulder abd 45 deg Supine abd 45* 78* Supine 75 deg, shoulder abd 45 deg Supine 75 deg shoulder abd 45 deg  Elbow flexion 145 148      Elbow extension 0 0      Wrist flexion          Wrist extension          Wrist ulnar deviation          Wrist radial deviation          Wrist pronation          Wrist supination          (Blank rows = not tested)   UPPER EXTREMITY MMT:   MMT Right eval Left eval Right 12/15/2021  Shoulder flexion 3/5 5/5 5/5  Shoulder extension 3/5 5/5   Shoulder  abduction 3/5 5/5 5/5  Shoulder adduction       Shoulder internal rotation 3/5 5/5 5/5  Shoulder external rotation 3/5 5/5 4+/5  Middle trapezius       Lower trapezius       Elbow flexion       Elbow extension       Wrist flexion       Wrist extension       Wrist ulnar deviation       Wrist radial deviation       Wrist pronation       Wrist supination       Grip strength (lbs)       (Blank rows = not tested)   FUNCTIONAL TESTING 11/27/21 Assessed AROM/PROM of wrist pronation, supination, flexion, extension for lateral elbow pain - all motions WNL and pain-free   PALPATION:  11/17/21 TTP along biceps tendon              TODAY'S TREATMENT:  12/15/2021: UBE fwd/back 6 mins fwd, 2 mins backward lvl 1.5 Lt sidelying Rt shoulder ER 2 lbs c towel under elbow x Standing tband green ER c towel under elbow x 15 15 Standing tband green IR c towel under elbow x 15 Standing doorway ER stretch mild 15 sec x 3 (trial in clinic, not for home) Standing hand behind back reach up x 5  Extended time in review of existing HEP, progression as noted in HEP handout.  Verbal cues on techniques.  Advice given on routine use and frequency of HEP performance as well as education on DOMS, general fatigue response to return to work.     12/10/21:  TherEx:  Pulleys: flexion x 2 minutes, abduction x 2 minutes UBE Level 1 x  3 minutes each direction Lifting 2 # weight to shoulder height shelf Lifting small cone to shelf that is above shoulder height Rows: Level 3 band x 20 holding 3 seconds Supine shoulder flexion using 2 # therapy ball c bil UE's Supine AAROM ER x 12 holding 3 sec Sidelying ER: x 20 c 2 # wt (with muscle fatigue reported) Sidelying Abd x 20 c 1# wt (with muscle fatigue reported)  12/08/21:  TherEx:  Pulleys: flexion x 2 minutes, abduction x 2 minutes Wall ladder x 10 in scaption UE ranger circles: x 1 minute each direction UE ranger scaption x 15 Rt UE only ER and IR holding elbow  at 90 degrees sidestepping x  15  Rows: Level 3 band x 20 holding 3 seconds Supine shoulder flexion using 2 # therapy ball c bil UE's Supine AAROM ER x 12 holding 3 sec Sidelying ER: x 15 c 1 # wt Sidelying Abd x 15 c 1# wt Manual: STM to Rt shoulder and Rt forearm x 8 minutes     PATIENT EDUCATION: Education details: HEP progression, POC discussion Person educated: Patient Education method: Explanation, Demonstration, Tactile cues, Verbal cues, and Handouts Education comprehension: verbalized understanding and returned demonstration     HOME EXERCISE PROGRAM: Access Code: [email or web address] URL: [email or web address] Date: 12/15/2021 Prepared by: Scot Jun  Exercises - Standing Row with Anchored Resistance  - 2 x daily - 7 x weekly - 2 sets - 10 reps - 3 seconds hold - Sidelying Shoulder ER with Towel and Dumbbell  - 1-2 x daily - 7 x weekly - 2 sets - 10 reps - Shoulder External Rotation with Anchored Resistance (Mirrored)  - 1-2 x daily - 7 x weekly - 3 sets - 10-15 reps - Shoulder Internal Rotation with Resistance  - 1-2 x daily - 7 x weekly - 3 sets - 10-15 reps - Standing Shoulder Internal Rotation Stretch with Hands Behind Back (Mirrored)  - 1-2 x daily - 7 x weekly - 1 sets - 10 reps - 5 hold     ASSESSMENT:   CLINICAL IMPRESSION: Pt has attended 8 visits overall during course of treatment, reporting reduced complaints and reduced difficulty.  See objective data for updated information. Pt still had complaints of Rt shoulder pain c strength deficits that can impact functional activity including work tasks.  Pt to continue to benefit from combination of skilled PT services and HEP to continue progression.    OBJECTIVE IMPAIRMENTS decreased ROM, decreased strength, increased edema, impaired UE functional use, and pain.    ACTIVITY LIMITATIONS carrying, lifting, sleeping, dressing, and reach over head   PARTICIPATION LIMITATIONS: cleaning, laundry, shopping, and  community activity   PERSONAL FACTORS allergy, hyperlipidemia, osteoporosis, thyroid disease are also affecting patient's functional outcome.    REHAB POTENTIAL: Good   CLINICAL DECISION MAKING: Stable/uncomplicated   EVALUATION COMPLEXITY: Low     GOALS: Goals reviewed with patient? Yes   SHORT TERM GOALS: Target date: 12/05/21  (Remove Blue Hyperlink) Patient will demonstrate independent use of initial home exercise program to maintain progress from in clinic treatments. Goal status: met for HEP to date 12/01/21     LONG TERM GOALS: Target date: 01/12/2022 Patient will demonstrate/report pain at worst less than or equal to 2/10 to facilitate minimal limitation in daily activity secondary to pain symptoms. Goal status: Revised 12/15/2021   Patient will demonstrate independent use of home exercise program to facilitate ability to maintain/progress functional gains from skilled physical therapy services. Goal status: Revised - 12/15/2021   Patient will demonstrate FOTO outcome > or = 78 % to indicate reduced disability due to condition. Goal status: MET - 12/15/2021   Pt will improve her Rt shoulder strength to grossly 5/5 in order to improve functional mobility.  Goal status: Revised - 12/15/2021       5.  Pt will be able to lift 5 # from counter to over head shelf c no pain.   Goal status: Revised - 12/15/2021       PLAN: PT FREQUENCY: 1x/week   PT DURATION: 4 weeks.   PLANNED INTERVENTIONS: Therapeutic exercises, Therapeutic activity, Neuromuscular re-education, Balance training, Gait training, Patient/Family education, Self Care, Joint mobilization, Dry Needling,  Electrical stimulation, Cryotherapy, Vasopneumatic device, Ultrasound, and Manual therapy   PLAN FOR NEXT SESSION:  Reassess new strengthening program for home use.  Discharge planning pending symptoms.     Scot Jun, PT, DPT, OCS, ATC 12/15/21  4:13 PM

## 2021-12-16 ENCOUNTER — Other Ambulatory Visit: Payer: Self-pay | Admitting: Family Medicine

## 2021-12-16 ENCOUNTER — Other Ambulatory Visit (HOSPITAL_BASED_OUTPATIENT_CLINIC_OR_DEPARTMENT_OTHER): Payer: Self-pay

## 2021-12-16 DIAGNOSIS — M8588 Other specified disorders of bone density and structure, other site: Secondary | ICD-10-CM | POA: Diagnosis not present

## 2021-12-16 DIAGNOSIS — E2839 Other primary ovarian failure: Secondary | ICD-10-CM

## 2021-12-16 DIAGNOSIS — E039 Hypothyroidism, unspecified: Secondary | ICD-10-CM | POA: Diagnosis not present

## 2021-12-16 DIAGNOSIS — Z0000 Encounter for general adult medical examination without abnormal findings: Secondary | ICD-10-CM | POA: Diagnosis not present

## 2021-12-16 DIAGNOSIS — E7800 Pure hypercholesterolemia, unspecified: Secondary | ICD-10-CM | POA: Diagnosis not present

## 2021-12-16 MED ORDER — ATORVASTATIN CALCIUM 20 MG PO TABS
ORAL_TABLET | ORAL | 3 refills | Status: DC
  Filled 2021-12-16: qty 90, 90d supply, fill #0
  Filled 2022-03-16: qty 90, 90d supply, fill #1
  Filled 2022-06-14: qty 90, 90d supply, fill #2
  Filled 2022-09-08: qty 90, 90d supply, fill #3

## 2021-12-16 MED ORDER — LEVOTHYROXINE SODIUM 88 MCG PO TABS
ORAL_TABLET | ORAL | 3 refills | Status: DC
Start: 2021-12-16 — End: 2021-12-17
  Filled 2021-12-16: qty 90, 90d supply, fill #0

## 2021-12-17 ENCOUNTER — Encounter: Admitting: Rehabilitative and Restorative Service Providers"

## 2021-12-17 ENCOUNTER — Other Ambulatory Visit (HOSPITAL_BASED_OUTPATIENT_CLINIC_OR_DEPARTMENT_OTHER): Payer: Self-pay

## 2021-12-17 MED ORDER — LEVOTHYROXINE SODIUM 100 MCG PO TABS
100.0000 ug | ORAL_TABLET | Freq: Every day | ORAL | 3 refills | Status: DC
  Filled 2021-12-17: qty 90, 90d supply, fill #0

## 2021-12-18 ENCOUNTER — Other Ambulatory Visit (HOSPITAL_BASED_OUTPATIENT_CLINIC_OR_DEPARTMENT_OTHER): Payer: Self-pay

## 2022-01-01 ENCOUNTER — Ambulatory Visit (INDEPENDENT_AMBULATORY_CARE_PROVIDER_SITE_OTHER): Payer: Federal, State, Local not specified - PPO | Admitting: Rehabilitative and Restorative Service Providers"

## 2022-01-01 ENCOUNTER — Encounter: Payer: Self-pay | Admitting: Rehabilitative and Restorative Service Providers"

## 2022-01-01 DIAGNOSIS — M25511 Pain in right shoulder: Secondary | ICD-10-CM

## 2022-01-01 DIAGNOSIS — M25611 Stiffness of right shoulder, not elsewhere classified: Secondary | ICD-10-CM

## 2022-01-01 DIAGNOSIS — M6281 Muscle weakness (generalized): Secondary | ICD-10-CM | POA: Diagnosis not present

## 2022-01-01 NOTE — Therapy (Signed)
OUTPATIENT PHYSICAL THERAPY TREATMENT NOTE/ DISCHARGE SUMMARY   Patient Name: Alyssa Estrada MRN: [id number] DOB:05-29-1960, 61 y.o., female Today's Date: 01/01/2022  PCP: Carol Ada MD  REFERRING PROVIDER: Meredith Pel, MD  PHYSICAL THERAPY DISCHARGE SUMMARY  Visits from Start of Care: 9  Current functional level related to goals / functional outcomes: See objective/assessment   Remaining deficits: See objective/assessment   Education / Equipment: HEP indicated below   Patient agrees to discharge. Patient goals were partially met. Patient is being discharged due to  meeting stated rehab goals, being pleased with current functional level.   END OF SESSION:   PT End of Session - 01/01/22 1259     Visit Number 9    Number of Visits 12    Date for PT Re-Evaluation 01/12/22    PT Start Time 1300    PT Stop Time 1336    PT Time Calculation (min) 36 min    Activity Tolerance Patient tolerated treatment well    Behavior During Therapy WFL for tasks assessed/performed            Past Medical History:  Diagnosis Date   Allergy    Hyperlipidemia    Osteoporosis    osteopenia   Thyroid disease    Past Surgical History:  Procedure Laterality Date   ORIF HUMERUS FRACTURE Right 10/09/2021   Procedure: RIGHT SHOULDER OPEN REDUCTION INTERNAL FIXATION (ORIF) PROXIMAL HUMERUS FRACTURE;  Surgeon: Meredith Pel, MD;  Location: [place];  Service: Orthopedics;  Laterality: Right;   TONSILLECTOMY     Patient Active Problem List   Diagnosis Date Noted   Closed fracture of proximal end of right humerus with routine healing     REFERRING DIAG:  S42.291A (ICD-10-CM) - Other closed displaced fracture of proximal end of right humerus, initial encounter  THERAPY DIAG:  Acute pain of right shoulder  Muscle weakness (generalized)  Stiffness of right shoulder, not elsewhere classified  Rationale for Evaluation and Treatment Rehabilitation  PERTINENT HISTORY:  allergy, hyperlipidemia, osteoporosis, thyroid disease  PRECAUTIONS: no lifting more than 5 pounds  SUBJECTIVE:  She has been doing really well. She has minimal discomfort with less tightness. Heat and massage both help sx. She has some tightness with putting her hand behind her back and is sometimes forgetting about the lifting restriction. She went on a trip to [place] with some reduced shoulder activity but still felt well.  PAIN:  NPRS scale: 0/10 upon arrival Pain location: Rt shoulder Pain description: achy Aggravating factors: certain movements Relieving factors: changing positions   OBJECTIVE: (objective measures completed at initial evaluation unless otherwise dated)  DIAGNOSTIC FINDINGS:  Performed on 11/07/21 : AP axillary lateral outlet radiographs right shoulder reviewed.  Hardware  unchanged in position and alignment for proximal humerus fracture.  No  lucencies around the pegs or screws.  No additional fracture or  dislocation.   PATIENT SURVEYS:              01/01/2022: FOTO update: 86% 12/15/2021:  FOTO update: 78% 11/17/21: FOTO 61% (predicted 72% )   COGNITION: 11/17/21: Overall cognitive status: Within functional limits for tasks assessed                                  SENSATION: WFL   POSTURE: 11/17/21: Rounded shoulders, forward head   UPPER EXTREMITY ROM:     ROM Right 11/17/21 Active Left 11/17/21 Active Rt 11/24/21  Active Right 12/01/21 active Rt 12/08/21 Active Rt 12/10/21 Rt 01/01/22 Active  Shoulder flexion 140 164 155 supine 174* Seated 175* Glenohumeral Motion 136* Supine 175 Supine 174 Supine 178  Shoulder extension 30 40       Shoulder abduction 128 170 138 Supine 180* Seated 176* Glenohumeral Motion 125* Supine 176 Supine 174 Supine  180  Shoulder adduction           Shoulder internal rotation 70 c shoulder abd 45 deg 76 c shoulder abd 45 deg 70 c shoulder abd 45 deg Supine abd 45* 79*    Supine 84 deg shoulder abd  45 deg  Shoulder external rotation 70 c shoulder abd 45 deg 82 c shoulder abd 45 deg 76 c shoulder abd 45 deg Supine abd 45* 78* Supine 75 deg, shoulder abd 45 deg Supine 75 deg shoulder abd 45 deg Supine 78 deg shoulder abd 45 deg  Elbow flexion 145 148       Elbow extension 0 0       Wrist flexion           Wrist extension           Wrist ulnar deviation           Wrist radial deviation           Wrist pronation           Wrist supination           (Blank rows = not tested)   UPPER EXTREMITY MMT:   MMT Right eval Left eval Right 12/15/2021 Right  01/01/2022  Shoulder flexion 3/5 5/5 5/5 5/5  Shoulder extension 3/5 5/5    Shoulder abduction 3/5 5/5 5/5 5/5  Shoulder adduction        Shoulder internal rotation 3/5 5/5 5/5 5/5  Shoulder external rotation 3/5 5/5 4+/5 4/5  Middle trapezius        Lower trapezius        Elbow flexion        Elbow extension        Wrist flexion        Wrist extension        Wrist ulnar deviation        Wrist radial deviation        Wrist pronation        Wrist supination        Grip strength (lbs)        (Blank rows = not tested)   FUNCTIONAL TESTING 11/27/21 Assessed AROM/PROM of wrist pronation, supination, flexion, extension for lateral elbow pain - all motions WNL and pain-free   PALPATION:  11/17/21 TTP along biceps tendon              TODAY'S TREATMENT:  01/01/2022 TherEx:  UBE Level 1.5 x  3 minutes each direction Standing rows blue band x 20 Standing shoulder extension blue band x 15 Standing shoulder IR blue band x 15 Standing shoulder ER blue band x 15, noted fatigue ROM and MMT testing as indicated in objective HEP updated, printed, reviewed  12/15/2021: UBE fwd/back 6 mins fwd, 2 mins backward lvl 1.5 Lt sidelying Rt shoulder ER 2 lbs c towel under elbow x Standing tband green ER c towel under elbow x 15 15 Standing tband green IR c towel under elbow x 15 Standing doorway ER stretch mild 15 sec x 3 (trial in clinic, not  for home) Standing hand behind back reach up x 5  Extended  time in review of existing HEP, progression as noted in HEP handout.  Verbal cues on techniques.  Advice given on routine use and frequency of HEP performance as well as education on DOMS, general fatigue response to return to work.    12/10/21:  TherEx:  Pulleys: flexion x 2 minutes, abduction x 2 minutes UBE Level 1 x  3 minutes each direction Lifting 2 # weight to shoulder height shelf Lifting small cone to shelf that is above shoulder height Rows: Level 3 band x 20 holding 3 seconds Supine shoulder flexion using 2 # therapy ball c bil UE's Supine AAROM ER x 12 holding 3 sec Sidelying ER: x 20 c 2 # wt (with muscle fatigue reported) Sidelying Abd x 20 c 1# wt (with muscle fatigue reported)  12/08/21:  TherEx:  Pulleys: flexion x 2 minutes, abduction x 2 minutes Wall ladder x 10 in scaption UE ranger circles: x 1 minute each direction UE ranger scaption x 15 Rt UE only ER and IR holding elbow at 90 degrees sidestepping x 15  Rows: Level 3 band x 20 holding 3 seconds Supine shoulder flexion using 2 # therapy ball c bil UE's Supine AAROM ER x 12 holding 3 sec Sidelying ER: x 15 c 1 # wt Sidelying Abd x 15 c 1# wt Manual: STM to Rt shoulder and Rt forearm x 8 minutes     PATIENT EDUCATION: Education details: HEP progression, POC discussion Person educated: Patient Education method: Explanation, Demonstration, Tactile cues, Verbal cues, and Handouts Education comprehension: verbalized understanding and returned demonstration     HOME EXERCISE PROGRAM: Access Code: [email or web address] URL: [email or web address] Date: 01/01/2022 Prepared by: Scot Jun  Exercises - Standing Row with Anchored Resistance  - 2 x daily - 7 x weekly - 2 sets - 10 reps - 3 seconds hold - Standing Shoulder Extension with Anchored Resistance  - 2 x daily - 7 x weekly - 2 sets - 10 reps - 3 seconds hold - Sidelying Shoulder ER with  Towel and Dumbbell  - 1-2 x daily - 7 x weekly - 2 sets - 10 reps - Shoulder External Rotation with Anchored Resistance (Mirrored)  - 1-2 x daily - 7 x weekly - 3 sets - 10-15 reps - Shoulder Internal Rotation with Resistance  - 1-2 x daily - 7 x weekly - 3 sets - 10-15 reps - Standing Shoulder Internal Rotation Stretch with Hands Behind Back (Mirrored)  - 1-2 x daily - 7 x weekly - 1 sets - 10 reps - 5 hold    ASSESSMENT:   CLINICAL IMPRESSION: Alyssa Estrada is discharged today to home with HEP suggestions. All STGs and LTGs are met with the exception of partially meeting the MMT strength goal at 4/5 shoulder ER strength, with significant improvements in strength in all gross shoulder motions. Active range is within functional limits and patient reports no restrictions in functional abilities with the exception of the lifting restriction placed by MD. She is discharged with updated HEP and recommendation to continue exercise for strength and mobility, and has no further questions or concerns.   OBJECTIVE IMPAIRMENTS decreased ROM, decreased strength, increased edema, impaired UE functional use, and pain.    ACTIVITY LIMITATIONS carrying, lifting, sleeping, dressing, and reach over head   PARTICIPATION LIMITATIONS: cleaning, laundry, shopping, and community activity   PERSONAL FACTORS allergy, hyperlipidemia, osteoporosis, thyroid disease are also affecting patient's functional outcome.    REHAB POTENTIAL: Good   CLINICAL DECISION MAKING: Stable/uncomplicated   EVALUATION  COMPLEXITY: Low     GOALS: Goals reviewed with patient? Yes   SHORT TERM GOALS: Target date: 12/05/21  (Remove Blue Hyperlink) Patient will demonstrate independent use of initial home exercise program to maintain progress from in clinic treatments. Goal status: met for HEP to date 12/01/21     LONG TERM GOALS: Target date: 01/12/2022 Patient will demonstrate/report pain at worst less than or equal to 2/10 to facilitate  minimal limitation in daily activity secondary to pain symptoms. Goal status: met 01/01/22   Patient will demonstrate independent use of home exercise program to facilitate ability to maintain/progress functional gains from skilled physical therapy services. Goal status: met 01/01/22   Patient will demonstrate FOTO outcome > or = 78 % to indicate reduced disability due to condition. Goal status: MET - 12/15/2021   Pt will improve her Rt shoulder strength to grossly 5/5 in order to improve functional mobility.  Goal status: partially met 01/01/22, ER 4/5       5.  Pt will be able to lift 5 # from counter to over head shelf c no pain.   Goal status: met 01/01/22       PLAN: PT FREQUENCY: 1x/week   PT DURATION: 4 weeks.   PLANNED INTERVENTIONS: Therapeutic exercises, Therapeutic activity, Neuromuscular re-education, Balance training, Gait training, Patient/Family education, Self Care, Joint mobilization, Dry Needling, Electrical stimulation, Cryotherapy, Vasopneumatic device, Ultrasound, and Manual therapy   PLAN FOR NEXT SESSION:  discharge to home 9/14    [facility], Student-PT 01/01/2022, 4:01 PM  This entire session of physical therapy was performed under the direct supervision of PT signing evaluation /treatment. PT reviewed note and agrees.  Scot Jun, PT, DPT, OCS, ATC 01/01/22  4:01 PM

## 2022-01-05 DIAGNOSIS — Z713 Dietary counseling and surveillance: Secondary | ICD-10-CM | POA: Diagnosis not present

## 2022-01-06 ENCOUNTER — Telehealth: Payer: Self-pay

## 2022-01-06 ENCOUNTER — Telehealth: Payer: Self-pay | Admitting: Orthopedic Surgery

## 2022-01-06 NOTE — Telephone Encounter (Signed)
LMVM advising could pick up at front desk.

## 2022-01-06 NOTE — Telephone Encounter (Signed)
IC patient to advise this had been completed. No answer. LMVM

## 2022-01-06 NOTE — Telephone Encounter (Signed)
Patients insurance has requested LMN for CPM. Can you please dictate and I will create note and send.

## 2022-01-06 NOTE — Telephone Encounter (Signed)
Alyssa Estrada is a patient who sustained a proximal humerus fracture several months ago.  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  Her surgery went well but as a matter of medical necessity in order to facilitate return to work a postoperative CPM was utilized in order to prevent adhesive capsulitis in the shoulder.  Adhesive capsulitis is very common after proximal humerus fractures treated with plate fixation.  Has a matter of medical necessity the CPM machine was utilized and she was able to return to work in an expedient fashion with nearly full range of motion of her shoulder.  If you have any questions please not hesitate to call.  Thank you very much

## 2022-01-06 NOTE — Telephone Encounter (Signed)
Patient called advised her daughter Alyssa Estrada will pick up the note and show ID.

## 2022-01-09 NOTE — Therapy (Addendum)
OUTPATIENT PHYSICAL THERAPY VESTIBULAR EVALUATION DISCHARGE SUMMARY     Patient Name: Alyssa Estrada MRN: [id number] DOB:05/29/1960, 61 y.o., female Today's Date: 01/12/2022  PCP: Carol Ada, MD REFERRING PROVIDER: Lois Huxley, PA    PT End of Session - 01/12/22 1014     Visit Number 1    Number of Visits 4    Date for PT Re-Evaluation 02/09/22    Authorization Type [employer] and [employer]    PT Start Time 0930    PT Stop Time 1000    PT Time Calculation (min) 30 min    Activity Tolerance Patient tolerated treatment well    Behavior During Therapy [facility] for tasks assessed/performed             Past Medical History:  Diagnosis Date   Allergy    Hyperlipidemia    Osteoporosis    osteopenia   Thyroid disease    Past Surgical History:  Procedure Laterality Date   ORIF HUMERUS FRACTURE Right 10/09/2021   Procedure: RIGHT SHOULDER OPEN REDUCTION INTERNAL FIXATION (ORIF) PROXIMAL HUMERUS FRACTURE;  Surgeon: Meredith Pel, MD;  Location: [facility];  Service: Orthopedics;  Laterality: Right;   TONSILLECTOMY     Patient Active Problem List   Diagnosis Date Noted   Closed fracture of proximal end of right humerus with routine healing     ONSET DATE: 2013 (intermittent)  REFERRING DIAG: R42 (ICD-10-CM) - Dizziness and giddiness   THERAPY DIAG:  BPPV (benign paroxysmal positional vertigo), bilateral - Plan: PT plan of care cert/re-cert  Dizziness and giddiness - Plan: PT plan of care cert/re-cert  Rationale for Evaluation and Treatment Rehabilitation  SUBJECTIVE:   SUBJECTIVE STATEMENT: Pt with chronic hx of vertigo, consistent with room spinning when lying down.  She noticed it more when getting down on the floor with a personal trainer.  PCP gave her Brandt-Daroff but that increased nausea so she had to stop.    Pt accompanied by: self  PERTINENT HISTORY: allergy, hyperlipidemia, osteoporosis, thyroid disease, recent fall with humerus fx   PAIN:  Are you  having pain? No  PRECAUTIONS: None  WEIGHT BEARING RESTRICTIONS No  FALLS: Has patient fallen in last 6 months? Yes. Number of falls 1  LIVING ENVIRONMENT: Lives with: lives with their spouse  PLOF: Independent and Leisure: beach, hiking, walking dogs, was going to Physiological scientist  PATIENT GOALS improve dizziness and nausea  OBJECTIVE:   DIAGNOSTIC FINDINGS: n/a  COGNITION: Overall cognitive status: Within functional limits for tasks assessed   SENSATION: WFL  POSTURE: rounded shoulders and forward head   VESTIBULAR ASSESSMENT  GENERAL OBSERVATION: no symptoms at rest   SYMPTOM BEHAVIOR: Subjective history: see above Non-Vestibular symptoms: nausea/vomiting Type of dizziness: Spinning/Vertigo Frequency: most times with lying down Duration: seconds (up to 10 sec), delayed onset Aggravating factors: Induced by position change: lying supine, rolling to the right, and rolling to the left Relieving factors:  let it settle Progression of symptoms: unchanged  OCULOMOTOR EXAM: Ocular Alignment: normal Ocular ROM: No Limitations Spontaneous Nystagmus: absent Gaze-Induced Nystagmus: absent Smooth Pursuits: intact Saccades: intact   VESTIBULAR - OCULAR REFLEX:  Head-Impulse Test: HIT Right: positive with catch up saccade HIT Left: negative Dynamic Visual Acuity: Not able to be assessed   POSITIONAL TESTING: Right Dix-Hallpike: upbeating, right nystagmus and Duration: 5-8 seconds  MOTION SENSITIVITY:  Motion Sensitivity Quotient  Intensity: 0 = none, 1 = Lightheaded, 2 = Mild, 3 = Moderate, 4 = Severe, 5 = Vomiting  Intensity  1. Sitting to supine   2. Supine to L side   3. Supine to R side   4. Supine to sitting   5. L Hallpike-Dix   6. Up from L    7. R Hallpike-Dix   8. Up from R    9. Sitting, head  tipped to L knee   10. Head up from L  knee   11. Sitting, head  tipped to R knee   12. Head up from R  knee   13. Sitting head turns x5    14.Sitting head nods x5   15. In stance, 180  turn to L    16. In stance, 180  turn to R     OTHOSTATICS: no completed; did have mild lightheadedness upon sitting after Epley's  FUNCTIONAL GAIT:  Independent with ambulation   VESTIBULAR TREATMENT:  Canalith Repositioning: Epley Right: Number of Reps: 2, Response to Treatment: comment: symptoms improved, still present on 2nd rep, and Comment: vibration on 2nd rep to Rt mastoid process x 10 sec  Gaze Adaptation: N/A  Habituation: Other: deferred today  PATIENT EDUCATION: Education details: BPPV Person educated: Patient Education method: Explanation, Demonstration, and Handouts Education comprehension: verbalized understanding   GOALS: Goals reviewed with patient? Yes   LONG TERM GOALS: Target date: 02/09/2022    Demonstrate negative positional testing Goal status: INITIAL  2.  Independent with HEP if issued Goal status: INITIAL  3.  Report 50% improvement in symptoms for decreased dizziness and ability to perform bed mobility without symptoms Goal status: INITIAL  ASSESSMENT:  CLINICAL IMPRESSION: Patient is a 61 y.o. female who was seen today for physical therapy evaluation and treatment for vertigo.  Clinical presentation and findings consistent with Rt pBPPV, but may have multi canal involvement.  She may have cupulolithiasis as well due to persistent nystagmus on second rep and increased time since initial onset.  OBJECTIVE IMPAIRMENTS dizziness.   ACTIVITY LIMITATIONS bed mobility  PARTICIPATION LIMITATIONS: community activity  PERSONAL FACTORS 3+ comorbidities: allergy, hyperlipidemia, osteoporosis, thyroid disease, recent fall with humerus fx  are also affecting patient's functional outcome.   REHAB POTENTIAL: Excellent  CLINICAL DECISION MAKING: Stable/uncomplicated  EVALUATION COMPLEXITY: Low   PLAN: PT FREQUENCY: 1x/week  PT DURATION: 4 weeks  PLANNED INTERVENTIONS: Therapeutic  exercises, Therapeutic activity, Neuromuscular re-education, Balance training, Patient/Family education, Self Care, Vestibular training, Canalith repositioning, Dry Needling, Electrical stimulation, Cryotherapy, Moist heat, Taping, Manual therapy, and Re-evaluation  PLAN FOR NEXT SESSION: reassess Rt post canal, check Lt posterior and horizontal if needed   Laureen Abrahams, PT, DPT 01/12/22 10:16 AM     PHYSICAL THERAPY DISCHARGE SUMMARY  Visits from Start of Care: 1  Current functional level related to goals / functional outcomes: See above   Remaining deficits: None, canceled follow up appts as vertigo resolved   Education / Equipment: N/a   Patient agrees to discharge. Patient goals were met. Patient is being discharged due to being pleased with the current functional level.  Laureen Abrahams, PT, DPT 05/07/22 12:14 PM  [facility] [address] [place], [place], [address] Phone: [phone]   Fax:  [phone]

## 2022-01-12 ENCOUNTER — Ambulatory Visit (INDEPENDENT_AMBULATORY_CARE_PROVIDER_SITE_OTHER): Admitting: Surgical

## 2022-01-12 ENCOUNTER — Ambulatory Visit (INDEPENDENT_AMBULATORY_CARE_PROVIDER_SITE_OTHER): Payer: Federal, State, Local not specified - PPO

## 2022-01-12 ENCOUNTER — Other Ambulatory Visit: Payer: Self-pay

## 2022-01-12 ENCOUNTER — Encounter: Payer: Self-pay | Admitting: Surgical

## 2022-01-12 ENCOUNTER — Encounter: Payer: Self-pay | Admitting: Physical Therapy

## 2022-01-12 ENCOUNTER — Ambulatory Visit (INDEPENDENT_AMBULATORY_CARE_PROVIDER_SITE_OTHER): Payer: Federal, State, Local not specified - PPO | Admitting: Physical Therapy

## 2022-01-12 DIAGNOSIS — S42291A Other displaced fracture of upper end of right humerus, initial encounter for closed fracture: Secondary | ICD-10-CM | POA: Diagnosis not present

## 2022-01-12 DIAGNOSIS — H8113 Benign paroxysmal vertigo, bilateral: Secondary | ICD-10-CM

## 2022-01-12 DIAGNOSIS — R42 Dizziness and giddiness: Secondary | ICD-10-CM | POA: Diagnosis not present

## 2022-01-12 NOTE — Progress Notes (Signed)
Post-Op Visit Note   Patient: Alyssa Estrada           Date of Birth: 1961/01/25           MRN: [id number] Visit Date: 01/12/2022 PCP: Carol Ada, MD   Assessment & Plan:  Chief Complaint:  Chief Complaint  Patient presents with   Right Shoulder - Follow-up    ORIF proximal humerus fracture 10/09/2021   Visit Diagnoses:  1. Other closed displaced fracture of proximal end of right humerus, initial encounter     Plan: Patient is a 61 year old female who presents s/p right proximal humerus fracture ORIF on 10/09/2021.  She states that she is doing very well.  She finished physical therapy and has transition to home exercise program.  She wants to be able to use her 5 to 10 pound weights that she has at home.  Her sleeping is improving and she can now lay on her right side for about 1 hour until it gets sore and she has to turn over.  Does not wake her up with pain at night unless she lays on her right shoulder too much.  Pain is progressively improving.  Does not really have to take any significant medications for pain.  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  On exam, patient has 50 degrees external rotation, 90 degrees abduction, 170 degrees forward elevation.  Internal rotation behind the back to around T8.  Excellent rotator cuff strength of supra, infra, subscap rated 5/5.  Axillary nerve intact with deltoid firing.  Intact FPL, finger abduction, grip strength testing, pronation/supination, bicep, tricep, deltoid.  Incision is well-healed from prior surgery.  Plan is to continue with home exercise program.  She has excellent range of motion.  Radiographs demonstrate continued fracture line that is present but there is increased consolidation and increased surrounding callus compared with last set of radiographs.  With her clinical improvement and radiographic improvement, think that she can slowly increase how much she is lifting using the amount  of soreness and pain in her shoulder as a guide.  If she experiences too much pain with certain exercise she should back off.  Follow-up for 2 months clinical recheck with Dr. Marlou Sa and likely release at that time.  Call with any concerns in the meantime.  She may return to doing full cases at work.  Follow-Up Instructions: No follow-ups on file.   Orders:  Orders Placed This Encounter  Procedures   XR Shoulder Right   No orders of the defined types were placed in this encounter.   Imaging: No results found.  PMFS History: Patient Active Problem List   Diagnosis Date Noted   Closed fracture of proximal end of right humerus with routine healing    Past Medical History:  Diagnosis Date   Allergy    Hyperlipidemia    Osteoporosis    osteopenia   Thyroid disease     Family History  Problem Relation Age of Onset   Pancreatic cancer Father    Pancreatic cancer Paternal Grandmother    Colon cancer Neg Hx    Colon polyps Neg Hx    Esophageal cancer Neg Hx    Stomach cancer Neg Hx    Rectal cancer Neg Hx     Past Surgical History:  Procedure Laterality Date   ORIF HUMERUS FRACTURE Right 10/09/2021   Procedure: RIGHT SHOULDER OPEN REDUCTION INTERNAL FIXATION (ORIF) PROXIMAL HUMERUS FRACTURE;  Surgeon: Marlou Sa,  Tonna Corner, MD;  Location: [facility];  Service: Orthopedics;  Laterality: Right;   TONSILLECTOMY     Social History   Occupational History   Not on file  Tobacco Use   Smoking status: Never   Smokeless tobacco: Never  Vaping Use   Vaping Use: Never used  Substance and Sexual Activity   Alcohol use: Yes    Comment: once every couple of months/social   Drug use: No   Sexual activity: Not on file

## 2022-01-20 ENCOUNTER — Encounter: Payer: Self-pay | Admitting: Physical Therapy

## 2022-01-21 ENCOUNTER — Encounter: Payer: Federal, State, Local not specified - PPO | Admitting: Physical Therapy

## 2022-01-21 ENCOUNTER — Encounter: Admitting: Physical Therapy

## 2022-01-29 DIAGNOSIS — Z713 Dietary counseling and surveillance: Secondary | ICD-10-CM | POA: Diagnosis not present

## 2022-02-16 DIAGNOSIS — E039 Hypothyroidism, unspecified: Secondary | ICD-10-CM | POA: Diagnosis not present

## 2022-02-17 ENCOUNTER — Other Ambulatory Visit (HOSPITAL_BASED_OUTPATIENT_CLINIC_OR_DEPARTMENT_OTHER): Payer: Self-pay

## 2022-02-17 MED ORDER — LEVOTHYROXINE SODIUM 112 MCG PO TABS
112.0000 ug | ORAL_TABLET | Freq: Every day | ORAL | 1 refills | Status: DC
  Filled 2022-02-17: qty 90, 90d supply, fill #0
  Filled 2022-05-18: qty 90, 90d supply, fill #1

## 2022-03-05 DIAGNOSIS — Z713 Dietary counseling and surveillance: Secondary | ICD-10-CM | POA: Diagnosis not present

## 2022-03-16 ENCOUNTER — Ambulatory Visit (INDEPENDENT_AMBULATORY_CARE_PROVIDER_SITE_OTHER): Payer: Federal, State, Local not specified - PPO | Admitting: Orthopedic Surgery

## 2022-03-16 ENCOUNTER — Other Ambulatory Visit (HOSPITAL_BASED_OUTPATIENT_CLINIC_OR_DEPARTMENT_OTHER): Payer: Self-pay

## 2022-03-16 ENCOUNTER — Encounter: Payer: Self-pay | Admitting: Orthopedic Surgery

## 2022-03-16 ENCOUNTER — Ambulatory Visit (INDEPENDENT_AMBULATORY_CARE_PROVIDER_SITE_OTHER): Payer: Federal, State, Local not specified - PPO

## 2022-03-16 DIAGNOSIS — S42291A Other displaced fracture of upper end of right humerus, initial encounter for closed fracture: Secondary | ICD-10-CM

## 2022-03-16 NOTE — Progress Notes (Signed)
Office Visit Note   Patient: Alyssa Estrada           Date of Birth: 09-25-1960           MRN: [id number] Visit Date: 03/16/2022 Requested by: Carol Ada, MD [place],   [id number] PCP: Carol Ada, MD  Subjective: Chief Complaint  Patient presents with   Right Shoulder - Follow-up    ORIF proximal humerus fracture 10/09/2021    HPI: Alyssa Estrada is a 61 y.o. female who presents to the office reporting right shoulder pain.  She is now about 9 months out right proximal humerus fracture open reduction and internal fixation..  She is doing well overall.  Still has occasional pain with some movements but does not take any pain medication..                ROS: All systems reviewed are negative as they relate to the chief complaint within the history of present illness.  Patient denies fevers or chills.  Assessment & Plan: Visit Diagnoses:  1. Other closed displaced fracture of proximal end of right humerus, initial encounter     Plan: Impression is patient is doing well she has very good range of motion.  Plan is activity as tolerated but we did talk about things in the waiting room to avoid.  Follow-up with [place] as needed.  She did use the CPM as part of her rehab protocol which I think is a very positive difference in her current functional status.  Follow-Up Instructions: No follow-ups on file.   Orders:  Orders Placed This Encounter  Procedures   XR Shoulder Right   No orders of the defined types were placed in this encounter.     Procedures: No procedures performed   Clinical Data: No additional findings.  Objective: Vital Signs: There were no vitals taken for this visit.  Physical Exam:  Constitutional: Patient appears well-developed HEENT:  Head: Normocephalic Eyes:EOM are normal Neck: Normal range of motion Cardiovascular: Normal rate Pulmonary/chest: Effort normal Neurologic: Patient is alert Skin: Skin is  warm Psychiatric: Patient has normal mood and affect  Ortho Exam: Ortho exam demonstrates functional deltoid.  Passive range of motion is 60/100/160.  Excellent rotator cuff strength with no coarse grinding or crepitus with internal ex rotation of the arm.  Incision well-healed.  Deltoid fires.  Specialty Comments:  No specialty comments available.  Imaging: XR Shoulder Right  Result Date: 03/16/2022 AP axillary radiographs right shoulder reviewed.  Hardware in good position alignment no complicating features.  No evidence of hardware lucency or loosening.  Fracture appears to be healed with sclerosis and callus formation noted at the inferior humeral neck.    PMFS History: Patient Active Problem List   Diagnosis Date Noted   Closed fracture of proximal end of right humerus with routine healing    Past Medical History:  Diagnosis Date   Allergy    Hyperlipidemia    Osteoporosis    osteopenia   Thyroid disease     Family History  Problem Relation Age of Onset   Pancreatic cancer Father    Pancreatic cancer Paternal Grandmother    Colon cancer Neg Hx    Colon polyps Neg Hx    Esophageal cancer Neg Hx    Stomach cancer Neg Hx    Rectal cancer Neg Hx     Past Surgical History:  Procedure Laterality Date   ORIF HUMERUS FRACTURE Right 10/09/2021  Procedure: RIGHT SHOULDER OPEN REDUCTION INTERNAL FIXATION (ORIF) PROXIMAL HUMERUS FRACTURE;  Surgeon: Meredith Pel, MD;  Location: [facility];  Service: Orthopedics;  Laterality: Right;   TONSILLECTOMY     Social History   Occupational History   Not on file  Tobacco Use   Smoking status: Never   Smokeless tobacco: Never  Vaping Use   Vaping Use: Never used  Substance and Sexual Activity   Alcohol use: Yes    Comment: once every couple of months/social   Drug use: No   Sexual activity: Not on file

## 2022-03-18 ENCOUNTER — Other Ambulatory Visit (HOSPITAL_BASED_OUTPATIENT_CLINIC_OR_DEPARTMENT_OTHER): Payer: Self-pay

## 2022-03-18 MED ORDER — AREXVY 120 MCG/0.5ML IM SUSR
INTRAMUSCULAR | 0 refills | Status: AC
  Filled 2022-03-18: qty 1, 1d supply, fill #0

## 2022-04-01 DIAGNOSIS — E039 Hypothyroidism, unspecified: Secondary | ICD-10-CM | POA: Diagnosis not present

## 2022-04-17 DIAGNOSIS — Z713 Dietary counseling and surveillance: Secondary | ICD-10-CM | POA: Diagnosis not present

## 2022-04-23 DIAGNOSIS — M545 Low back pain, unspecified: Secondary | ICD-10-CM | POA: Diagnosis not present

## 2022-05-14 DIAGNOSIS — Z713 Dietary counseling and surveillance: Secondary | ICD-10-CM | POA: Diagnosis not present

## 2022-05-19 ENCOUNTER — Other Ambulatory Visit (HOSPITAL_BASED_OUTPATIENT_CLINIC_OR_DEPARTMENT_OTHER): Payer: Self-pay

## 2022-06-09 DIAGNOSIS — Z713 Dietary counseling and surveillance: Secondary | ICD-10-CM | POA: Diagnosis not present

## 2022-06-10 DIAGNOSIS — F419 Anxiety disorder, unspecified: Secondary | ICD-10-CM | POA: Diagnosis not present

## 2022-06-18 ENCOUNTER — Ambulatory Visit
Admission: RE | Admit: 2022-06-18 | Discharge: 2022-06-18 | Disposition: A | Discharge status: Home / Self Care | Payer: Federal, State, Local not specified - PPO | Source: Ambulatory Visit | Attending: Family Medicine | Admitting: Family Medicine

## 2022-06-18 DIAGNOSIS — Z780 Asymptomatic menopausal state: Secondary | ICD-10-CM | POA: Diagnosis not present

## 2022-06-18 DIAGNOSIS — E2839 Other primary ovarian failure: Secondary | ICD-10-CM

## 2022-06-18 DIAGNOSIS — M8589 Other specified disorders of bone density and structure, multiple sites: Secondary | ICD-10-CM | POA: Diagnosis not present

## 2022-06-25 DIAGNOSIS — F419 Anxiety disorder, unspecified: Secondary | ICD-10-CM | POA: Diagnosis not present

## 2022-07-08 DIAGNOSIS — F419 Anxiety disorder, unspecified: Secondary | ICD-10-CM | POA: Diagnosis not present

## 2022-07-08 DIAGNOSIS — Z713 Dietary counseling and surveillance: Secondary | ICD-10-CM | POA: Diagnosis not present

## 2022-07-30 DIAGNOSIS — F419 Anxiety disorder, unspecified: Secondary | ICD-10-CM | POA: Diagnosis not present

## 2022-08-10 ENCOUNTER — Other Ambulatory Visit (HOSPITAL_BASED_OUTPATIENT_CLINIC_OR_DEPARTMENT_OTHER): Payer: Self-pay

## 2022-08-11 DIAGNOSIS — Z713 Dietary counseling and surveillance: Secondary | ICD-10-CM | POA: Diagnosis not present

## 2022-08-13 ENCOUNTER — Other Ambulatory Visit (HOSPITAL_BASED_OUTPATIENT_CLINIC_OR_DEPARTMENT_OTHER): Payer: Self-pay

## 2022-08-13 MED ORDER — LEVOTHYROXINE SODIUM 112 MCG PO TABS
112.0000 ug | ORAL_TABLET | Freq: Every morning | ORAL | 1 refills | Status: DC
  Filled 2022-08-13: qty 90, 90d supply, fill #0
  Filled 2022-11-06: qty 90, 90d supply, fill #1

## 2022-08-17 DIAGNOSIS — F419 Anxiety disorder, unspecified: Secondary | ICD-10-CM | POA: Diagnosis not present

## 2022-08-25 ENCOUNTER — Other Ambulatory Visit (HOSPITAL_BASED_OUTPATIENT_CLINIC_OR_DEPARTMENT_OTHER): Payer: Self-pay

## 2022-08-25 DIAGNOSIS — R8271 Bacteriuria: Secondary | ICD-10-CM | POA: Diagnosis not present

## 2022-08-25 DIAGNOSIS — Z01419 Encounter for gynecological examination (general) (routine) without abnormal findings: Secondary | ICD-10-CM | POA: Diagnosis not present

## 2022-08-25 DIAGNOSIS — Z1231 Encounter for screening mammogram for malignant neoplasm of breast: Secondary | ICD-10-CM | POA: Diagnosis not present

## 2022-08-25 DIAGNOSIS — Z6826 Body mass index (BMI) 26.0-26.9, adult: Secondary | ICD-10-CM | POA: Diagnosis not present

## 2022-08-25 MED ORDER — ZOLPIDEM TARTRATE 10 MG PO TABS
10.0000 mg | ORAL_TABLET | Freq: Every day | ORAL | 1 refills | Status: AC
  Filled 2022-08-25: qty 30, 30d supply, fill #0
  Filled 2022-12-13: qty 30, 30d supply, fill #1

## 2022-08-27 ENCOUNTER — Other Ambulatory Visit (HOSPITAL_BASED_OUTPATIENT_CLINIC_OR_DEPARTMENT_OTHER): Payer: Self-pay

## 2022-08-27 MED ORDER — NITROFURANTOIN MONOHYD MACRO 100 MG PO CAPS
100.0000 mg | ORAL_CAPSULE | Freq: Two times a day (BID) | ORAL | 0 refills | Status: AC
  Filled 2022-08-27: qty 14, 7d supply, fill #0

## 2022-09-10 DIAGNOSIS — Z713 Dietary counseling and surveillance: Secondary | ICD-10-CM | POA: Diagnosis not present

## 2022-10-12 DIAGNOSIS — F419 Anxiety disorder, unspecified: Secondary | ICD-10-CM | POA: Diagnosis not present

## 2022-10-12 DIAGNOSIS — Z713 Dietary counseling and surveillance: Secondary | ICD-10-CM | POA: Diagnosis not present

## 2022-10-13 DIAGNOSIS — K5900 Constipation, unspecified: Secondary | ICD-10-CM | POA: Diagnosis not present

## 2022-10-13 DIAGNOSIS — N393 Stress incontinence (female) (male): Secondary | ICD-10-CM | POA: Diagnosis not present

## 2022-10-13 DIAGNOSIS — M6281 Muscle weakness (generalized): Secondary | ICD-10-CM | POA: Diagnosis not present

## 2022-10-20 DIAGNOSIS — M6281 Muscle weakness (generalized): Secondary | ICD-10-CM | POA: Diagnosis not present

## 2022-10-20 DIAGNOSIS — K5900 Constipation, unspecified: Secondary | ICD-10-CM | POA: Diagnosis not present

## 2022-10-20 DIAGNOSIS — N393 Stress incontinence (female) (male): Secondary | ICD-10-CM | POA: Diagnosis not present

## 2022-11-03 DIAGNOSIS — M6281 Muscle weakness (generalized): Secondary | ICD-10-CM | POA: Diagnosis not present

## 2022-11-03 DIAGNOSIS — K5900 Constipation, unspecified: Secondary | ICD-10-CM | POA: Diagnosis not present

## 2022-11-03 DIAGNOSIS — N393 Stress incontinence (female) (male): Secondary | ICD-10-CM | POA: Diagnosis not present

## 2022-11-06 ENCOUNTER — Other Ambulatory Visit: Payer: Self-pay

## 2022-11-09 DIAGNOSIS — F419 Anxiety disorder, unspecified: Secondary | ICD-10-CM | POA: Diagnosis not present

## 2022-11-10 DIAGNOSIS — K5900 Constipation, unspecified: Secondary | ICD-10-CM | POA: Diagnosis not present

## 2022-11-10 DIAGNOSIS — M6281 Muscle weakness (generalized): Secondary | ICD-10-CM | POA: Diagnosis not present

## 2022-11-10 DIAGNOSIS — N393 Stress incontinence (female) (male): Secondary | ICD-10-CM | POA: Diagnosis not present

## 2022-11-17 DIAGNOSIS — N393 Stress incontinence (female) (male): Secondary | ICD-10-CM | POA: Diagnosis not present

## 2022-11-17 DIAGNOSIS — K5900 Constipation, unspecified: Secondary | ICD-10-CM | POA: Diagnosis not present

## 2022-11-17 DIAGNOSIS — M6281 Muscle weakness (generalized): Secondary | ICD-10-CM | POA: Diagnosis not present

## 2022-11-23 DIAGNOSIS — F419 Anxiety disorder, unspecified: Secondary | ICD-10-CM | POA: Diagnosis not present

## 2022-11-24 DIAGNOSIS — M6281 Muscle weakness (generalized): Secondary | ICD-10-CM | POA: Diagnosis not present

## 2022-11-24 DIAGNOSIS — N393 Stress incontinence (female) (male): Secondary | ICD-10-CM | POA: Diagnosis not present

## 2022-11-24 DIAGNOSIS — K5900 Constipation, unspecified: Secondary | ICD-10-CM | POA: Diagnosis not present

## 2022-11-25 DIAGNOSIS — Z713 Dietary counseling and surveillance: Secondary | ICD-10-CM | POA: Diagnosis not present

## 2022-12-07 DIAGNOSIS — F419 Anxiety disorder, unspecified: Secondary | ICD-10-CM | POA: Diagnosis not present

## 2022-12-09 DIAGNOSIS — M6281 Muscle weakness (generalized): Secondary | ICD-10-CM | POA: Diagnosis not present

## 2022-12-09 DIAGNOSIS — K5900 Constipation, unspecified: Secondary | ICD-10-CM | POA: Diagnosis not present

## 2022-12-09 DIAGNOSIS — N393 Stress incontinence (female) (male): Secondary | ICD-10-CM | POA: Diagnosis not present

## 2022-12-13 ENCOUNTER — Other Ambulatory Visit (HOSPITAL_BASED_OUTPATIENT_CLINIC_OR_DEPARTMENT_OTHER): Payer: Self-pay

## 2022-12-14 ENCOUNTER — Other Ambulatory Visit (HOSPITAL_BASED_OUTPATIENT_CLINIC_OR_DEPARTMENT_OTHER): Payer: Self-pay

## 2022-12-14 ENCOUNTER — Other Ambulatory Visit: Payer: Self-pay

## 2022-12-15 ENCOUNTER — Other Ambulatory Visit (HOSPITAL_BASED_OUTPATIENT_CLINIC_OR_DEPARTMENT_OTHER): Payer: Self-pay

## 2022-12-15 MED ORDER — ATORVASTATIN CALCIUM 20 MG PO TABS
20.0000 mg | ORAL_TABLET | Freq: Every day | ORAL | 0 refills | Status: DC
  Filled 2022-12-15: qty 90, 90d supply, fill #0

## 2022-12-22 DIAGNOSIS — M6281 Muscle weakness (generalized): Secondary | ICD-10-CM | POA: Diagnosis not present

## 2022-12-22 DIAGNOSIS — K5900 Constipation, unspecified: Secondary | ICD-10-CM | POA: Diagnosis not present

## 2022-12-22 DIAGNOSIS — N393 Stress incontinence (female) (male): Secondary | ICD-10-CM | POA: Diagnosis not present

## 2022-12-23 DIAGNOSIS — Z713 Dietary counseling and surveillance: Secondary | ICD-10-CM | POA: Diagnosis not present

## 2022-12-28 DIAGNOSIS — F419 Anxiety disorder, unspecified: Secondary | ICD-10-CM | POA: Diagnosis not present

## 2023-01-12 ENCOUNTER — Other Ambulatory Visit (HOSPITAL_BASED_OUTPATIENT_CLINIC_OR_DEPARTMENT_OTHER): Payer: Self-pay

## 2023-01-12 DIAGNOSIS — M8588 Other specified disorders of bone density and structure, other site: Secondary | ICD-10-CM | POA: Diagnosis not present

## 2023-01-12 DIAGNOSIS — J309 Allergic rhinitis, unspecified: Secondary | ICD-10-CM | POA: Diagnosis not present

## 2023-01-12 DIAGNOSIS — E7800 Pure hypercholesterolemia, unspecified: Secondary | ICD-10-CM | POA: Diagnosis not present

## 2023-01-12 DIAGNOSIS — Z0000 Encounter for general adult medical examination without abnormal findings: Secondary | ICD-10-CM | POA: Diagnosis not present

## 2023-01-12 DIAGNOSIS — E039 Hypothyroidism, unspecified: Secondary | ICD-10-CM | POA: Diagnosis not present

## 2023-01-12 MED ORDER — LEVOTHYROXINE SODIUM 112 MCG PO TABS
112.0000 ug | ORAL_TABLET | Freq: Every morning | ORAL | 3 refills | Status: DC
  Filled 2023-01-12 – 2023-02-02 (×2): qty 90, 90d supply, fill #0
  Filled 2023-05-05: qty 90, 90d supply, fill #1
  Filled 2023-08-08: qty 90, 90d supply, fill #2
  Filled 2023-11-01: qty 90, 90d supply, fill #3

## 2023-01-12 MED ORDER — ATORVASTATIN CALCIUM 20 MG PO TABS
20.0000 mg | ORAL_TABLET | Freq: Every day | ORAL | 3 refills | Status: DC
  Filled 2023-01-12 – 2023-03-14 (×4): qty 90, 90d supply, fill #0
  Filled 2023-06-15: qty 90, 90d supply, fill #1
  Filled 2023-09-16: qty 90, 90d supply, fill #2
  Filled 2023-12-13: qty 90, 90d supply, fill #3

## 2023-01-18 DIAGNOSIS — F419 Anxiety disorder, unspecified: Secondary | ICD-10-CM | POA: Diagnosis not present

## 2023-01-18 DIAGNOSIS — Z713 Dietary counseling and surveillance: Secondary | ICD-10-CM | POA: Diagnosis not present

## 2023-02-02 ENCOUNTER — Other Ambulatory Visit (HOSPITAL_BASED_OUTPATIENT_CLINIC_OR_DEPARTMENT_OTHER): Payer: Self-pay

## 2023-02-02 ENCOUNTER — Other Ambulatory Visit: Payer: Self-pay

## 2023-02-03 ENCOUNTER — Other Ambulatory Visit (HOSPITAL_BASED_OUTPATIENT_CLINIC_OR_DEPARTMENT_OTHER): Payer: Self-pay

## 2023-02-15 DIAGNOSIS — F419 Anxiety disorder, unspecified: Secondary | ICD-10-CM | POA: Diagnosis not present

## 2023-02-17 DIAGNOSIS — Z713 Dietary counseling and surveillance: Secondary | ICD-10-CM | POA: Diagnosis not present

## 2023-02-26 ENCOUNTER — Other Ambulatory Visit (HOSPITAL_BASED_OUTPATIENT_CLINIC_OR_DEPARTMENT_OTHER): Payer: Self-pay

## 2023-02-26 MED ORDER — COVID-19 MRNA VAC-TRIS(PFIZER) 30 MCG/0.3ML IM SUSY
0.3000 mL | PREFILLED_SYRINGE | Freq: Once | INTRAMUSCULAR | 0 refills | Status: AC
  Filled 2023-02-26: qty 0.3, 1d supply, fill #0

## 2023-03-15 ENCOUNTER — Other Ambulatory Visit (HOSPITAL_BASED_OUTPATIENT_CLINIC_OR_DEPARTMENT_OTHER): Payer: Self-pay

## 2023-03-15 DIAGNOSIS — F419 Anxiety disorder, unspecified: Secondary | ICD-10-CM | POA: Diagnosis not present

## 2023-03-15 DIAGNOSIS — Z713 Dietary counseling and surveillance: Secondary | ICD-10-CM | POA: Diagnosis not present

## 2023-04-05 DIAGNOSIS — Z713 Dietary counseling and surveillance: Secondary | ICD-10-CM | POA: Diagnosis not present

## 2023-04-12 DIAGNOSIS — F419 Anxiety disorder, unspecified: Secondary | ICD-10-CM | POA: Diagnosis not present

## 2023-04-30 ENCOUNTER — Other Ambulatory Visit (HOSPITAL_BASED_OUTPATIENT_CLINIC_OR_DEPARTMENT_OTHER): Payer: Self-pay

## 2023-04-30 MED ORDER — NITROFURANTOIN MONOHYD MACRO 100 MG PO CAPS
100.0000 mg | ORAL_CAPSULE | Freq: Two times a day (BID) | ORAL | 0 refills | Status: AC
  Filled 2023-04-30: qty 14, 7d supply, fill #0

## 2023-05-05 DIAGNOSIS — Z713 Dietary counseling and surveillance: Secondary | ICD-10-CM | POA: Diagnosis not present

## 2023-05-19 DIAGNOSIS — F419 Anxiety disorder, unspecified: Secondary | ICD-10-CM | POA: Diagnosis not present

## 2023-06-07 DIAGNOSIS — F419 Anxiety disorder, unspecified: Secondary | ICD-10-CM | POA: Diagnosis not present

## 2023-06-09 DIAGNOSIS — Z713 Dietary counseling and surveillance: Secondary | ICD-10-CM | POA: Diagnosis not present

## 2023-07-07 DIAGNOSIS — Z713 Dietary counseling and surveillance: Secondary | ICD-10-CM | POA: Diagnosis not present

## 2023-07-07 DIAGNOSIS — F419 Anxiety disorder, unspecified: Secondary | ICD-10-CM | POA: Diagnosis not present

## 2023-08-04 DIAGNOSIS — Z713 Dietary counseling and surveillance: Secondary | ICD-10-CM | POA: Diagnosis not present

## 2023-08-31 ENCOUNTER — Other Ambulatory Visit (HOSPITAL_BASED_OUTPATIENT_CLINIC_OR_DEPARTMENT_OTHER): Payer: Self-pay

## 2023-08-31 MED ORDER — ZOLPIDEM TARTRATE 10 MG PO TABS
10.0000 mg | ORAL_TABLET | Freq: Every day | ORAL | 1 refills | Status: AC
  Filled 2023-08-31: qty 30, 30d supply, fill #0
  Filled 2023-12-13: qty 30, 30d supply, fill #1

## 2023-12-13 ENCOUNTER — Other Ambulatory Visit: Payer: Self-pay

## 2023-12-13 ENCOUNTER — Other Ambulatory Visit (HOSPITAL_BASED_OUTPATIENT_CLINIC_OR_DEPARTMENT_OTHER): Payer: Self-pay

## 2024-01-26 ENCOUNTER — Other Ambulatory Visit (HOSPITAL_BASED_OUTPATIENT_CLINIC_OR_DEPARTMENT_OTHER): Payer: Self-pay

## 2024-01-26 MED ORDER — ATORVASTATIN CALCIUM 20 MG PO TABS
20.0000 mg | ORAL_TABLET | Freq: Every day | ORAL | 3 refills | Status: AC
  Filled 2024-03-12: qty 90, 90d supply, fill #0

## 2024-01-26 MED ORDER — LEVOTHYROXINE SODIUM 112 MCG PO TABS
112.0000 ug | ORAL_TABLET | Freq: Every morning | ORAL | 3 refills | Status: AC
  Filled 2024-01-26: qty 90, 90d supply, fill #0
  Filled 2024-04-17: qty 90, 90d supply, fill #1

## 2024-01-27 ENCOUNTER — Other Ambulatory Visit (HOSPITAL_BASED_OUTPATIENT_CLINIC_OR_DEPARTMENT_OTHER): Payer: Self-pay | Admitting: Family Medicine

## 2024-01-27 DIAGNOSIS — E2839 Other primary ovarian failure: Secondary | ICD-10-CM

## 2024-03-01 ENCOUNTER — Other Ambulatory Visit: Payer: Self-pay

## 2024-03-01 ENCOUNTER — Other Ambulatory Visit (HOSPITAL_BASED_OUTPATIENT_CLINIC_OR_DEPARTMENT_OTHER): Payer: Self-pay

## 2024-03-01 MED ORDER — PREDNISOLONE ACETATE 1 % OP SUSP
1.0000 [drp] | Freq: Every day | OPHTHALMIC | 0 refills | Status: DC
  Filled 2024-03-01: qty 10, 21d supply, fill #0

## 2024-03-03 ENCOUNTER — Other Ambulatory Visit (HOSPITAL_BASED_OUTPATIENT_CLINIC_OR_DEPARTMENT_OTHER): Payer: Self-pay

## 2024-03-13 ENCOUNTER — Other Ambulatory Visit (HOSPITAL_BASED_OUTPATIENT_CLINIC_OR_DEPARTMENT_OTHER): Payer: Self-pay

## 2024-03-22 ENCOUNTER — Other Ambulatory Visit (HOSPITAL_BASED_OUTPATIENT_CLINIC_OR_DEPARTMENT_OTHER): Payer: Self-pay

## 2024-03-22 MED ORDER — PREDNISOLONE ACETATE 1 % OP SUSP
1.0000 [drp] | Freq: Three times a day (TID) | OPHTHALMIC | 0 refills | Status: DC
  Filled 2024-03-22: qty 10, 67d supply, fill #0

## 2024-04-18 ENCOUNTER — Other Ambulatory Visit (HOSPITAL_BASED_OUTPATIENT_CLINIC_OR_DEPARTMENT_OTHER): Payer: Self-pay

## 2024-04-18 ENCOUNTER — Other Ambulatory Visit (HOSPITAL_COMMUNITY): Payer: Self-pay

## 2024-05-24 IMAGING — RF DG HUMERUS 2V *R*
1 series · 3 of 3 positions shown · non-contrast
Comparison: Right shoulder radiographs 10/07/2021

CLINICAL DATA: Right shoulder open reduction internal fixation
proximal humerus fracture.

EXAM:
RIGHT HUMERUS - 2+ VIEW

[Series 1: run · 3 of 3 slices shown]
[im 1/3]
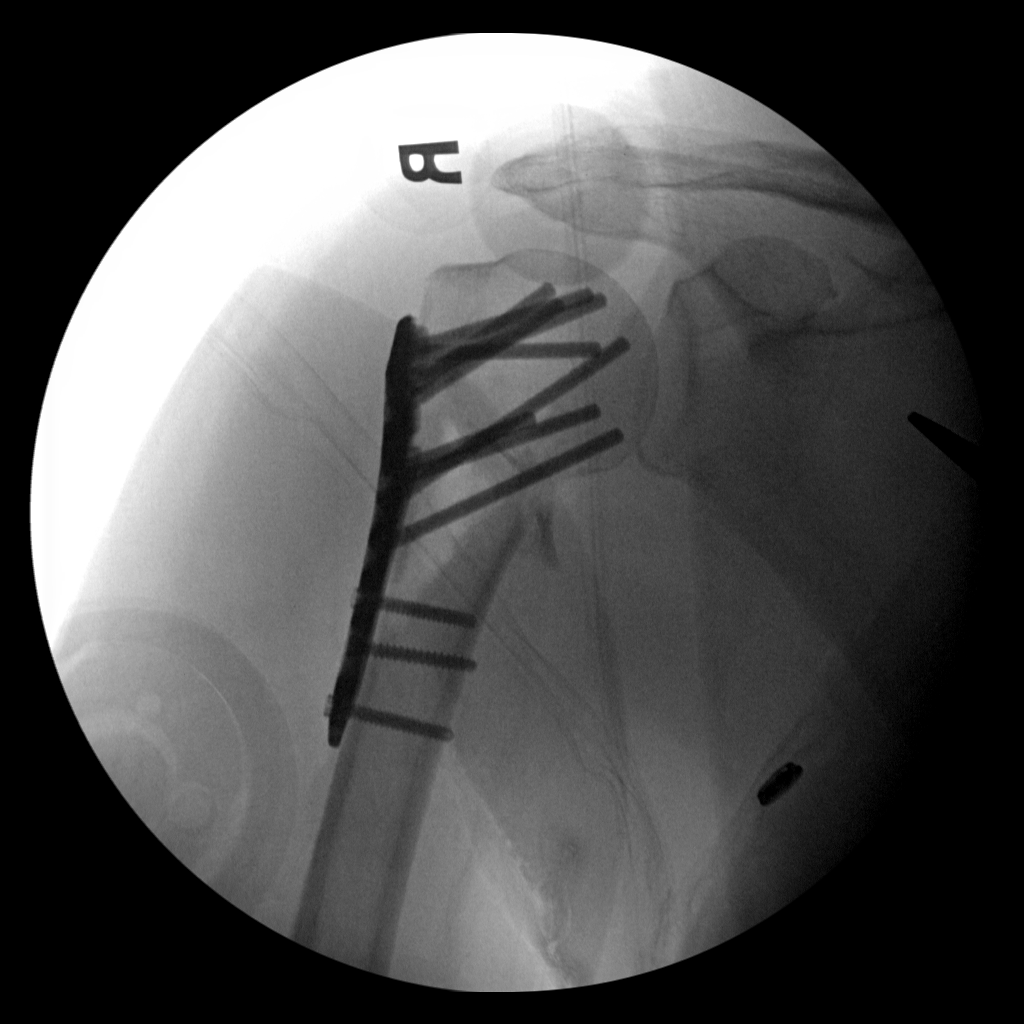
[im 2/3]
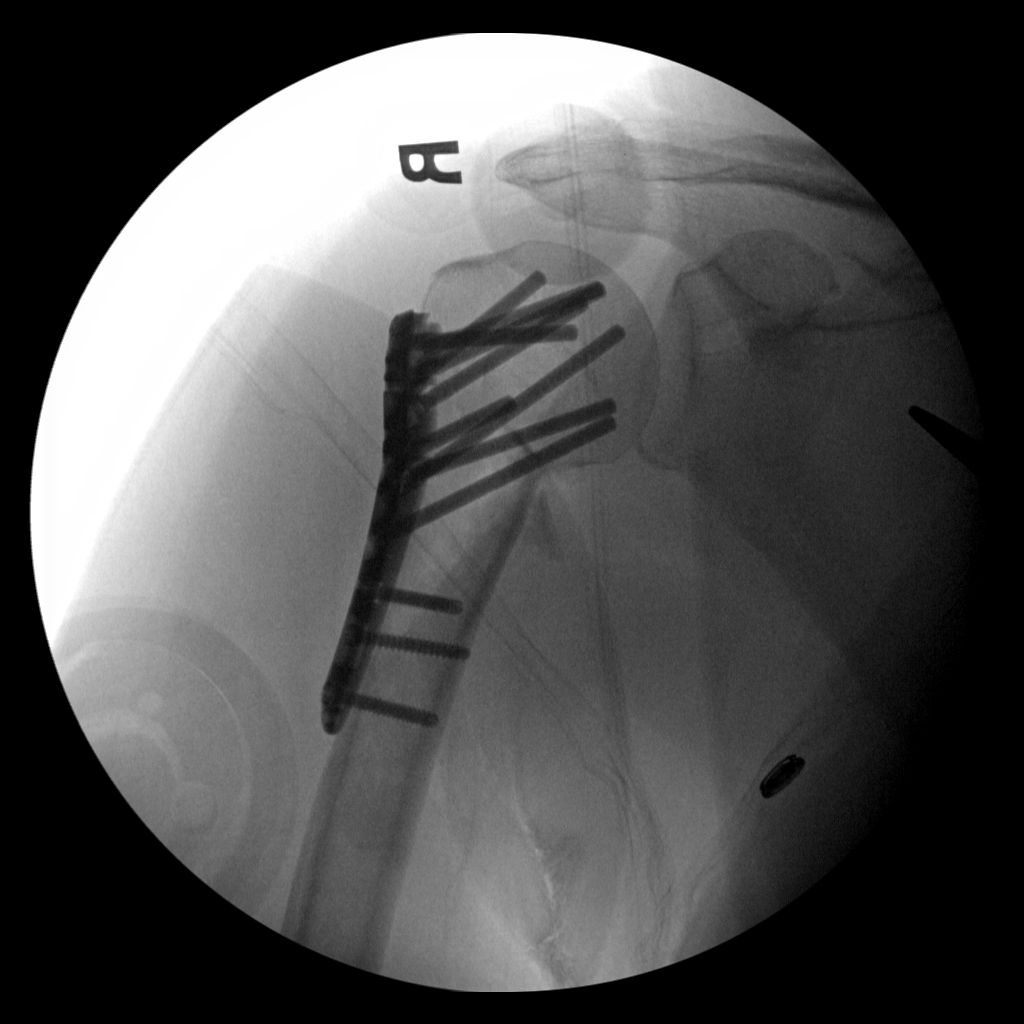
[im 3/3]
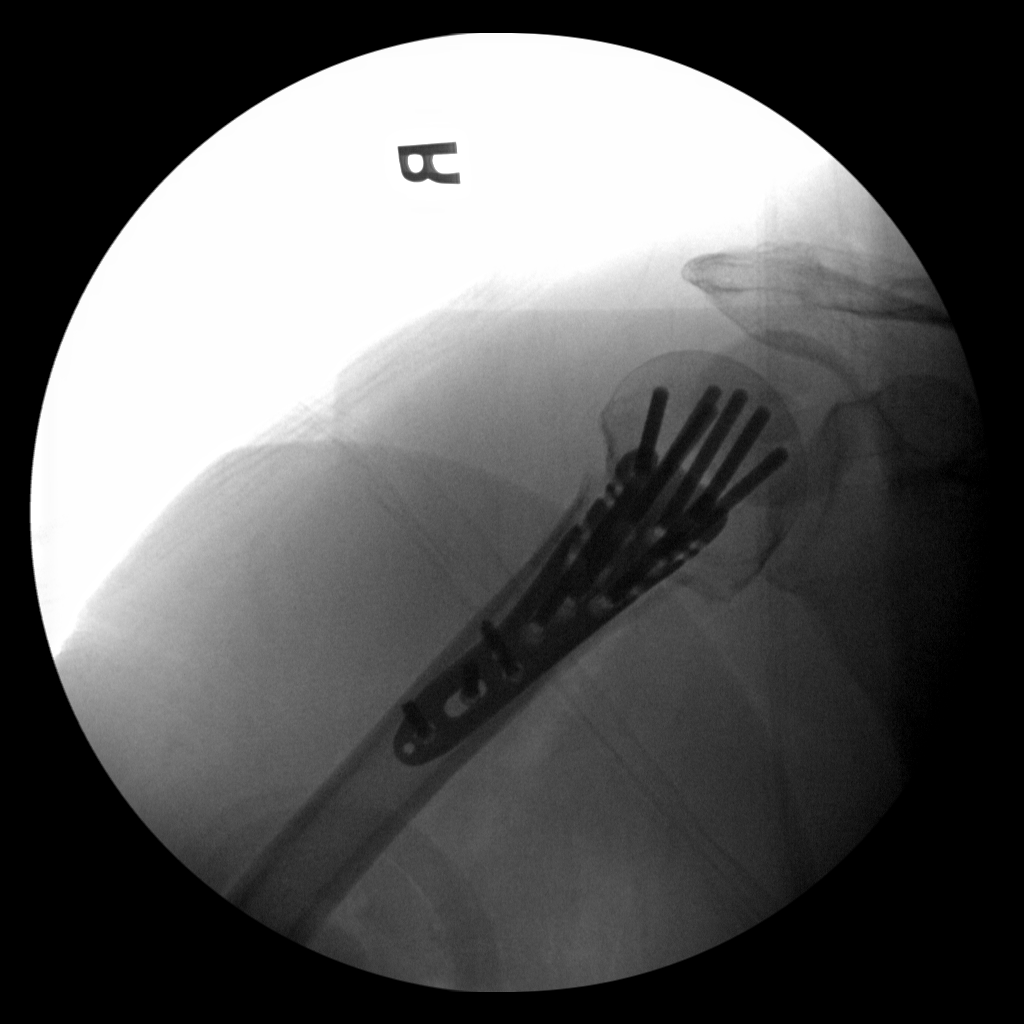

[3 of 3 positions shown; findings below may reference images not displayed]

FINDINGS: Images were performed intraoperatively without the presence of a
radiologist. Fluoroscopic images demonstrate interval lateral plate
and screw fixation of the proximal humerus with markedly improved
alignment of the prior displaced comminuted fracture.

Total fluoroscopy images: 3

Total fluoroscopy time: 42 seconds

Total dose: Radiation Exposure Index (as provided by the
fluoroscopic device): 3.92 mGy air Kerma

Please see intraoperative findings for further detail.
IMPRESSION: Intraoperative fluoroscopy for proximal humeral ORIF

## 2024-05-24 IMAGING — DX DG SHOULDER 1V*R*
2 series · 2 of 2 positions shown · non-contrast
Comparison: 10/07/2021

CLINICAL DATA: Status post ORIF right shoulder.

EXAM:
RIGHT SHOULDER - 1 VIEW

[humerus ap]
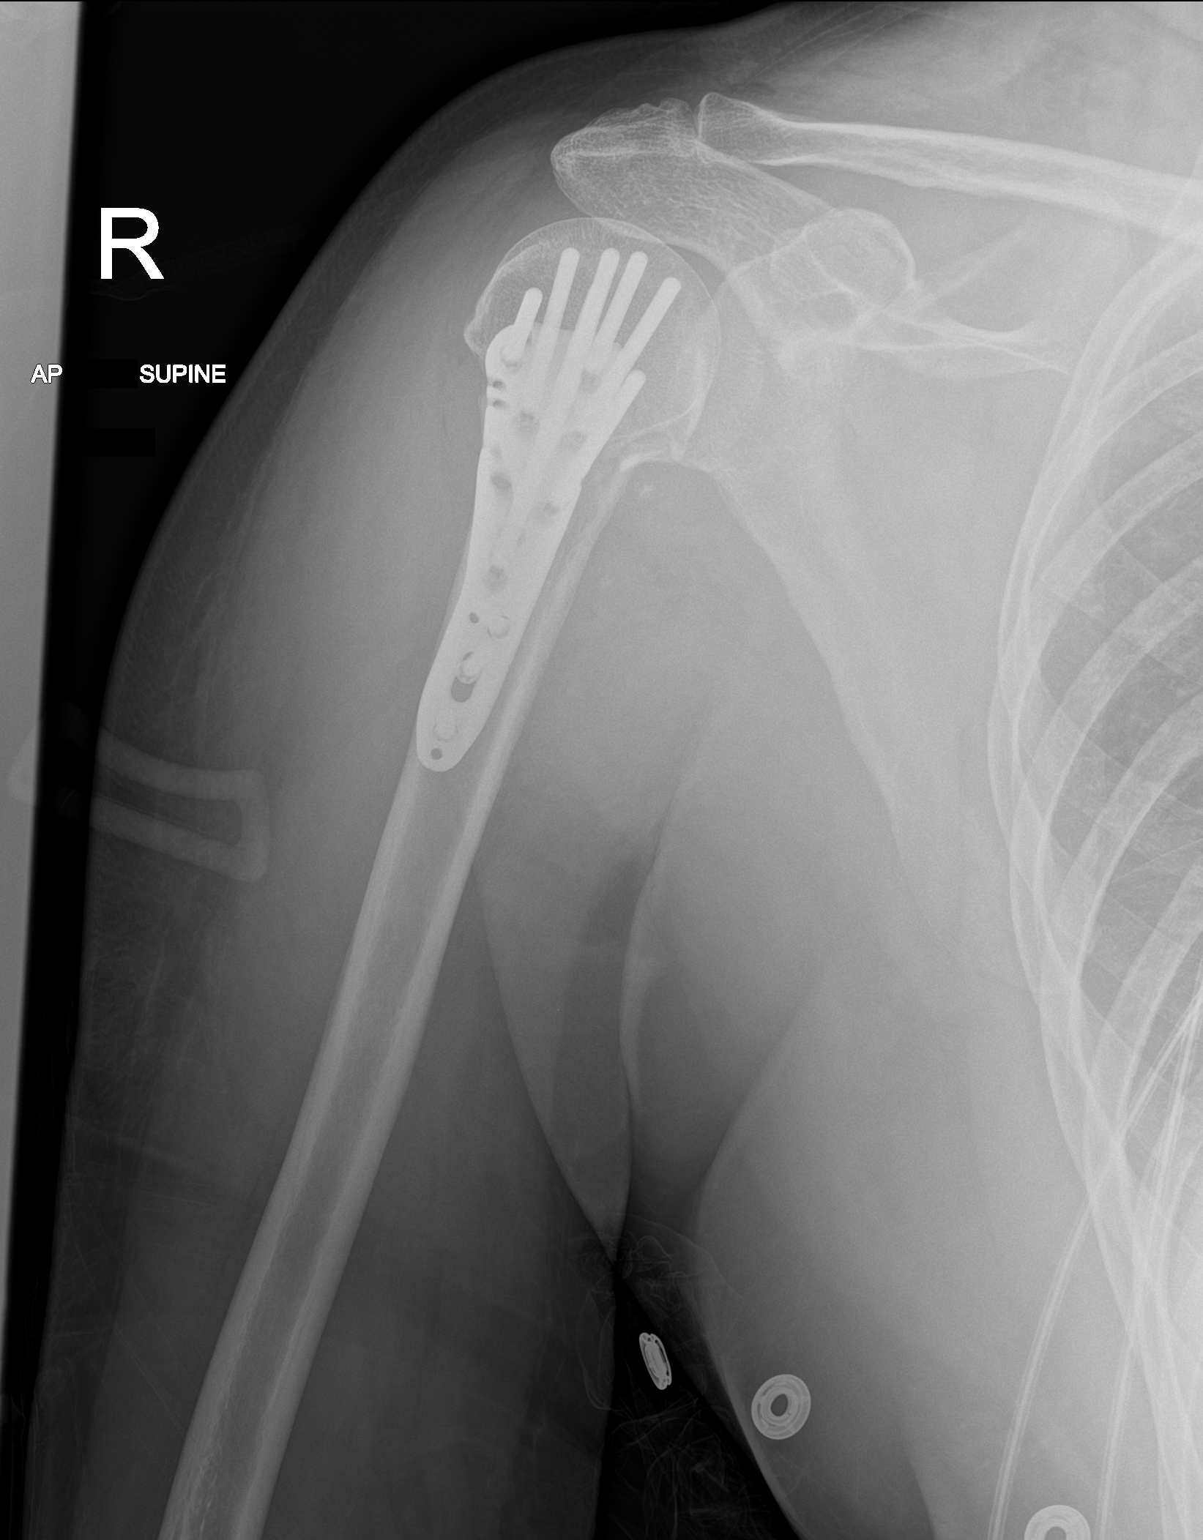

[humerus lat]
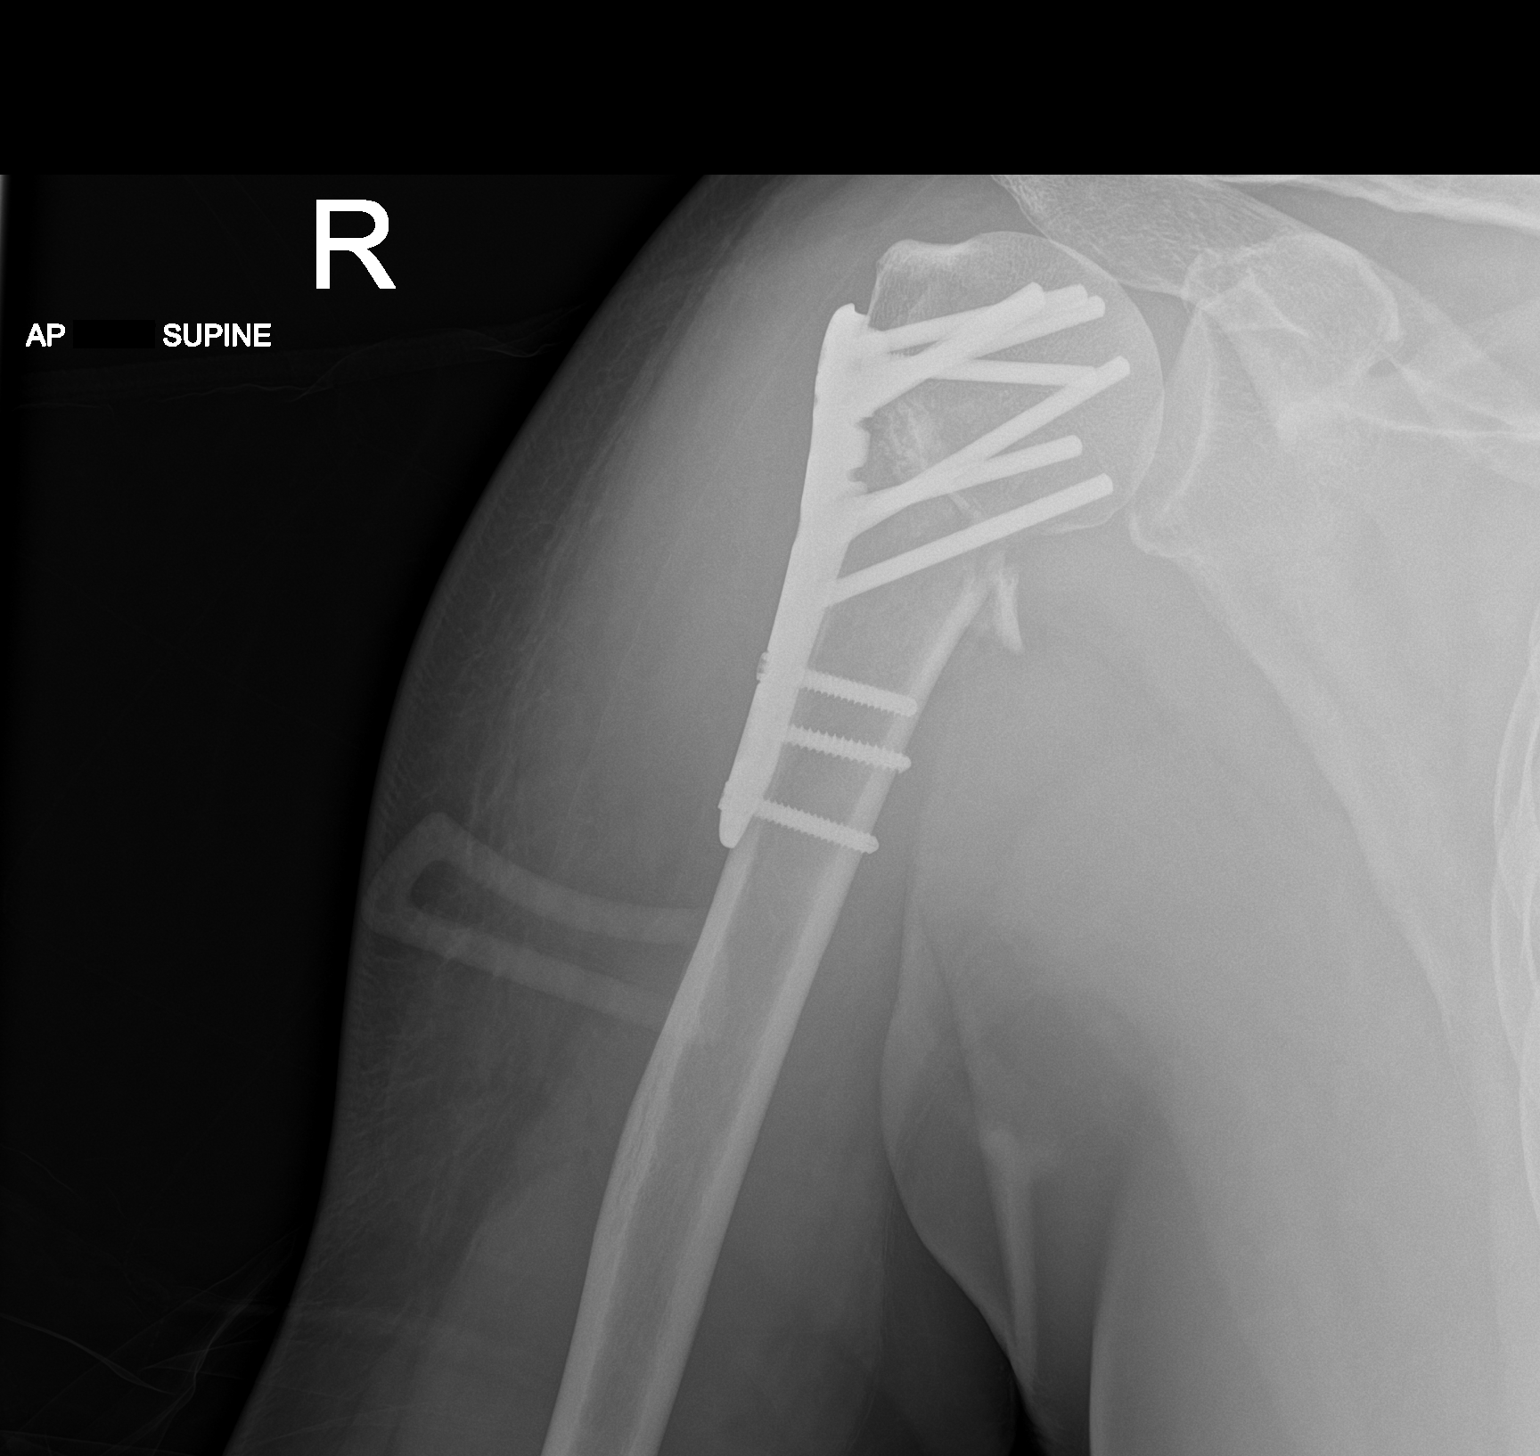

[2 of 2 positions shown; findings below may reference images not displayed]

FINDINGS: Postoperative changes from plate and screw fixation of comminuted
fracture involving the head and surgical neck of the proximal right
humerus. The hardware components and fracture fragments are in
anatomic alignment. No signs of dislocation.
IMPRESSION: Status post ORIF of proximal right humerus fracture.

## 2024-07-11 ENCOUNTER — Other Ambulatory Visit (HOSPITAL_BASED_OUTPATIENT_CLINIC_OR_DEPARTMENT_OTHER)
